# Patient Record
Sex: Female | Born: 1945 | Race: White | Hispanic: No | Marital: Married | Smoking: Never smoker
Health system: Southern US, Community
[De-identification: ages and names within clinical notes are randomized; demographics above are authoritative.]

## PROBLEM LIST (undated history)

## (undated) DIAGNOSIS — M1990 Unspecified osteoarthritis, unspecified site: Secondary | ICD-10-CM

## (undated) DIAGNOSIS — I499 Cardiac arrhythmia, unspecified: Secondary | ICD-10-CM

## (undated) DIAGNOSIS — M8580 Other specified disorders of bone density and structure, unspecified site: Secondary | ICD-10-CM

## (undated) DIAGNOSIS — I471 Supraventricular tachycardia, unspecified: Secondary | ICD-10-CM

## (undated) DIAGNOSIS — E756 Lipid storage disorder, unspecified: Secondary | ICD-10-CM

## (undated) DIAGNOSIS — I4892 Unspecified atrial flutter: Secondary | ICD-10-CM

## (undated) DIAGNOSIS — R0989 Other specified symptoms and signs involving the circulatory and respiratory systems: Secondary | ICD-10-CM

## (undated) DIAGNOSIS — R739 Hyperglycemia, unspecified: Secondary | ICD-10-CM

## (undated) DIAGNOSIS — N6019 Diffuse cystic mastopathy of unspecified breast: Secondary | ICD-10-CM

## (undated) DIAGNOSIS — I351 Nonrheumatic aortic (valve) insufficiency: Secondary | ICD-10-CM

## (undated) DIAGNOSIS — K2100 Gastro-esophageal reflux disease with esophagitis, without bleeding: Secondary | ICD-10-CM

## (undated) DIAGNOSIS — I10 Essential (primary) hypertension: Secondary | ICD-10-CM

## (undated) DIAGNOSIS — I4891 Unspecified atrial fibrillation: Secondary | ICD-10-CM

## (undated) DIAGNOSIS — G4730 Sleep apnea, unspecified: Secondary | ICD-10-CM

## (undated) DIAGNOSIS — E669 Obesity, unspecified: Secondary | ICD-10-CM

## (undated) DIAGNOSIS — H4010X0 Unspecified open-angle glaucoma, stage unspecified: Secondary | ICD-10-CM

## (undated) DIAGNOSIS — E785 Hyperlipidemia, unspecified: Secondary | ICD-10-CM

## (undated) DIAGNOSIS — K5790 Diverticulosis of intestine, part unspecified, without perforation or abscess without bleeding: Secondary | ICD-10-CM

## (undated) DIAGNOSIS — D369 Benign neoplasm, unspecified site: Secondary | ICD-10-CM

## (undated) HISTORY — DX: Hyperglycemia, unspecified: R73.9

## (undated) HISTORY — DX: Supraventricular tachycardia: I47.1

## (undated) HISTORY — DX: Other specified symptoms and signs involving the circulatory and respiratory systems: R09.89

## (undated) HISTORY — DX: Other specified disorders of bone density and structure, unspecified site: M85.80

## (undated) HISTORY — DX: Supraventricular tachycardia, unspecified: I47.10

## (undated) HISTORY — DX: Hyperlipidemia, unspecified: E78.5

## (undated) HISTORY — DX: Diverticulosis of intestine, part unspecified, without perforation or abscess without bleeding: K57.90

## (undated) HISTORY — DX: Unspecified open-angle glaucoma, stage unspecified: H40.10X0

## (undated) HISTORY — DX: Obesity, unspecified: E66.9

## (undated) HISTORY — DX: Lipid storage disorder, unspecified: E75.6

## (undated) HISTORY — DX: Benign neoplasm, unspecified site: D36.9

## (undated) HISTORY — DX: Essential (primary) hypertension: I10

## (undated) HISTORY — DX: Nonrheumatic aortic (valve) insufficiency: I35.1

## (undated) HISTORY — DX: Unspecified atrial flutter: I48.92

## (undated) HISTORY — DX: Gastro-esophageal reflux disease with esophagitis, without bleeding: K21.00

## (undated) HISTORY — DX: Unspecified osteoarthritis, unspecified site: M19.90

## (undated) HISTORY — DX: Sleep apnea, unspecified: G47.30

## (undated) HISTORY — DX: Diffuse cystic mastopathy of unspecified breast: N60.19

---

## 2002-04-21 ENCOUNTER — Other Ambulatory Visit: Admission: RE | Admit: 2002-04-21 | Discharge: 2002-04-21 | Payer: Self-pay | Admitting: Obstetrics & Gynecology

## 2002-07-22 ENCOUNTER — Other Ambulatory Visit: Admission: RE | Admit: 2002-07-22 | Discharge: 2002-07-22 | Payer: Self-pay | Admitting: Obstetrics & Gynecology

## 2003-07-15 ENCOUNTER — Other Ambulatory Visit: Admission: RE | Admit: 2003-07-15 | Discharge: 2003-07-15 | Payer: Self-pay | Admitting: Obstetrics & Gynecology

## 2004-01-06 ENCOUNTER — Other Ambulatory Visit: Admission: RE | Admit: 2004-01-06 | Discharge: 2004-01-06 | Payer: Self-pay | Admitting: *Deleted

## 2004-11-02 ENCOUNTER — Other Ambulatory Visit: Admission: RE | Admit: 2004-11-02 | Discharge: 2004-11-02 | Payer: Self-pay | Admitting: Obstetrics & Gynecology

## 2004-12-17 HISTORY — PX: OTHER SURGICAL HISTORY: SHX169

## 2006-01-02 ENCOUNTER — Other Ambulatory Visit: Admission: RE | Admit: 2006-01-02 | Discharge: 2006-01-02 | Payer: Self-pay | Admitting: Obstetrics & Gynecology

## 2007-12-18 HISTORY — PX: BREAST BIOPSY: SHX20

## 2015-12-18 DIAGNOSIS — R319 Hematuria, unspecified: Secondary | ICD-10-CM

## 2015-12-18 HISTORY — DX: Hematuria, unspecified: R31.9

## 2019-11-20 ENCOUNTER — Encounter (INDEPENDENT_AMBULATORY_CARE_PROVIDER_SITE_OTHER): Payer: Self-pay | Admitting: *Deleted

## 2019-12-08 ENCOUNTER — Other Ambulatory Visit (INDEPENDENT_AMBULATORY_CARE_PROVIDER_SITE_OTHER): Payer: Self-pay | Admitting: *Deleted

## 2019-12-08 ENCOUNTER — Encounter (INDEPENDENT_AMBULATORY_CARE_PROVIDER_SITE_OTHER): Payer: Self-pay | Admitting: *Deleted

## 2019-12-08 DIAGNOSIS — Z860100 Personal history of colon polyps, unspecified: Secondary | ICD-10-CM | POA: Insufficient documentation

## 2019-12-09 ENCOUNTER — Encounter (INDEPENDENT_AMBULATORY_CARE_PROVIDER_SITE_OTHER): Payer: Self-pay

## 2020-01-18 DIAGNOSIS — I639 Cerebral infarction, unspecified: Secondary | ICD-10-CM

## 2020-01-18 HISTORY — DX: Cerebral infarction, unspecified: I63.9

## 2020-01-19 ENCOUNTER — Telehealth (INDEPENDENT_AMBULATORY_CARE_PROVIDER_SITE_OTHER): Payer: Self-pay | Admitting: *Deleted

## 2020-01-19 ENCOUNTER — Encounter (INDEPENDENT_AMBULATORY_CARE_PROVIDER_SITE_OTHER): Payer: Self-pay | Admitting: *Deleted

## 2020-01-19 ENCOUNTER — Other Ambulatory Visit (INDEPENDENT_AMBULATORY_CARE_PROVIDER_SITE_OTHER): Payer: Self-pay | Admitting: *Deleted

## 2020-01-19 MED ORDER — SUPREP BOWEL PREP KIT 17.5-3.13-1.6 GM/177ML PO SOLN: 1.0000 | Freq: Once | ORAL | 0 refills | Status: AC

## 2020-01-19 NOTE — Telephone Encounter (Signed)
Patient needs suprep TCS sch'd 3/10

## 2020-01-25 ENCOUNTER — Telehealth (INDEPENDENT_AMBULATORY_CARE_PROVIDER_SITE_OTHER): Payer: Self-pay | Admitting: *Deleted

## 2020-01-25 NOTE — Telephone Encounter (Signed)
Referring MD/PCP: pomposini   Procedure: tcs  Reason/Indication:  Hx polyps  Has patient had this procedure before?  Yes, 2015  If so, when, by whom and where?    Is there a family history of colon cancer?  no  Who?  What age when diagnosed?    Is patient diabetic?   no      Does patient have prosthetic heart valve or mechanical valve?  no  Do you have a pacemaker/defibrillator?  no  Has patient ever had endocarditis/atrial fibrillation? no  Does patient use oxygen? no  Has patient had joint replacement within last 12 months?  no  Is patient constipated or do they take laxatives? no  Does patient have a history of alcohol/drug use?  no  Is patient on blood thinner such as Coumadin, Plavix and/or Aspirin? yes  Medications: asa 81 mg daily, celebrex 200 mg daily, pravastatin 40 mg daily, toprol 25 mg daily, losartan 100 mg daily, nexium 40 mg daily, niacin 1000 mg daily, latanoprost each eye, folic acid Q000111Q mcg daily, famotidine 20 mg daily, calcium 600 mg daily, vit d, fish oil flax seed oil, vit b, vit d3, claritian  Allergies: simvastatin, loratab  Medication Adjustment per Dr Laural Golden: asa 2 days  Procedure date & time:

## 2020-02-22 ENCOUNTER — Other Ambulatory Visit (HOSPITAL_COMMUNITY): Payer: Medicare Other

## 2020-02-24 ENCOUNTER — Encounter (HOSPITAL_COMMUNITY): Payer: Self-pay

## 2020-02-24 ENCOUNTER — Ambulatory Visit (HOSPITAL_COMMUNITY): Admit: 2020-02-24 | Payer: Medicare Other | Admitting: Internal Medicine

## 2020-02-24 SURGERY — COLONOSCOPY
Anesthesia: Moderate Sedation

## 2020-03-01 ENCOUNTER — Telehealth: Payer: Self-pay | Admitting: Neurology

## 2020-03-01 NOTE — Telephone Encounter (Signed)
I spoke with the pt. She said she had already called the health dept and they rescheduled her vaccine for the 6th. She verbalized appreciation for the call.

## 2020-03-01 NOTE — Telephone Encounter (Signed)
Pt is scheduled for her Covid-19 vaccine the day before her appointment 03-31. Pt would like to know if she should be ok to come in or if she should r/s.  Pt declined to option to just r/s now.  Pt states she will just wait to hear from RN

## 2020-03-15 NOTE — Progress Notes (Addendum)
[facility]    Provider:  Dr Jaynee Eagles Requesting Provider: Pomposini, Cherly Anderson, MD Primary Care Provider:  Clinton Quant, MD  CC:  Stroke  HPI:  Meghan Keller is a 74 y.o. female here as requested by Pomposini, Cherly Anderson, MD for cerebrovascular cerebrovascular accident, she has a past medical history of hyperlipidemia, lipid storage disease, obesity, elevated hemoglobin A1c, hypertension, aortic valve regurgitation, paroxysmal supraventricular tachycardia, cerebrovascular accident, arthritis, sleep apnea, carotid bruit, history of herpes zoster.  I reviewed extensive notes, MRI of the brain report from January 23, 2020, report reads multiple acute infarcts within the right cerebral hemisphere in a watershed distribution, no suspicious areas of enhancement are noted, CT of the head the day prior report states subacute infarct of the right middle cerebral artery distribution, underlying mass with vasogenic edema could also be considered, last when the MRI was suggested which was completed as above.  She has never smoked, hospital course showed she was admitted for acute ischemic cerebrovascular accident after presenting with left-sided weakness, CT head showed probable subacute infarct in the right MCA, carotid Doppler ultrasound was negative for hemodynamically significant stenosis, echocardiogram noted normal left ventricular size and normal systolic function with ejection fraction 60 to 65% mild ventricular dilation, borderline left ventricle wall thickness.  As above, MRI of the brain noted multiple acute infarcts in the right cerebral hemisphere in a watershed distribution.  Patient was treated with aspirin and Crestor.  She was transitioned to Eliquis for anticoagulation for stroke prophylaxis, she was evaluated by physical therapy and Occupational Therapy, neurology recommended 0 monitor for 2 weeks of discharge.  I reviewed labs which showed BUN 16, creatinine 0.52  otherwise unremarkable CBC.  The neurologist was Dr. Rob Hickman.   Thursday morning she got up, ran a lot of errands, she did not feel well that night, she got up the next day but no weakness, she did feel unsteady. She thought she may have covid as she was sneezing. She drove to a clinic and she was tested and it was negative, but she still didn't feel well, her blood pressure was 187/101 and she was tachycardic as well, usually it is around 140 or less and so it was unusual, she had a few SVTs but that day she wasn't paying attention. Her husband took her to the ER, CT and MRI confirmed the stroke. She ws told she ws in atrial fibrillation in the ED, but Dr. Delice Lesch at [facility] in [place] didn't have a record of the afib. Her cholesterol is ldl is < 70, she went to PT and OT and she feels much improved as fr as strength, she may have some sensory changes around the left leg in the mornings but no numbness or tingling, weakness improved, swallowing fine, no falls, using a walking aid and she is still in physical therapy. No vision changes or vision loss. She has mild sleep apnea, I encouraged her to use it.   Reviewed notes, labs and imaging from outside physicians, which showed:  [id number] 5.7  I reviewed MRI of the brain report, mild age-related cerebral atrophy, patchy areas of subcortical edema throughout the right frontal lobe insula and right parietal lobe, corresponding with areas of increased DWI low ADC signal, multiple acute infarcts within the right cerebral hemisphere in a watershed distribution, no suspicious areas of enhancement are noted.  I also reviewed report from the duplex carotid arteries, small amount calcified atherosclerotic plaque within the left carotid bulb, both vertebral arteries  are patent with antegrade flow, the estimate of stenosis include in this report: No significant carotid stenosis.  I reviewed echocardiogram which did not show PFO or thrombus.  Review of Systems: Patient  complains of symptoms per HPI as well as the following symptoms:weakness,fatigue. Pertinent negatives and positives per HPI. All others negative.   Social History   Socioeconomic History  . Marital status: Married    Spouse name: Not on file  . Number of children: 2  . Years of education: 12+  . Highest education level: Not on file  Occupational History  . Not on file  Tobacco Use  . Smoking status: Never Smoker  . Smokeless tobacco: Never Used  Substance and Sexual Activity  . Alcohol use: Never  . Drug use: Never  . Sexual activity: Not on file  Other Topics Concern  . Not on file  Social History Narrative   Lives at home with husband   Right handed   Caffeine: 2 cups/day    Social Determinants of Health   Financial Resource Strain:   . Difficulty of Paying Living Expenses:   Food Insecurity:   . Worried About Charity fundraiser in the Last Year:   . Arboriculturist in the Last Year:   Transportation Needs:   . Film/video editor (Medical):   Marland Kitchen Lack of Transportation (Non-Medical):   Physical Activity:   . Days of Exercise per Week:   . Minutes of Exercise per Session:   Stress:   . Feeling of Stress :   Social Connections:   . Frequency of Communication with Friends and Family:   . Frequency of Social Gatherings with Friends and Family:   . Attends Religious Services:   . Active Member of Clubs or Organizations:   . Attends Archivist Meetings:   Marland Kitchen Marital Status:   Intimate Partner Violence:   . Fear of Current or Ex-Partner:   . Emotionally Abused:   Marland Kitchen Physically Abused:   . Sexually Abused:     Family History  Problem Relation Age of Onset  . Bladder Cancer Mother   . Leukemia Mother   . Hypertension Mother   . Other Mother        smoked  . Esophageal cancer Father   . Hypertension Father   . Atrial fibrillation Father   . Other Father        smoked  . High Cholesterol Father   . Glaucoma Father   . Hypertension Brother   .  Heart attack Brother   . Hypertension Brother   . Atrial fibrillation Brother        heart ablation  . High Cholesterol Brother   . Stroke Paternal Grandfather        pt believes he had one in his 51s     Past Medical History:  Diagnosis Date  . Adenomatous polyp    ascending colon  . Aortic valve regurgitation    "mild"  . Arthritis   . Benign essential hypertension   . Blood in urine 2017  . Carotid bruit   . CVA (cerebral vascular accident) ([place]) 01/2020  . Diverticular disease   . Fibrocystic disease of breast   . GERD with esophagitis   . Hyperglycemia   . Hyperlipidemia   . Lipid storage disease   . Obesity   . Osteopenia   . PSVT (paroxysmal supraventricular tachycardia) ([place])   . Sleep apnea    "mild", was on CPAP before  stroke, pt states she needs to get back on it   . Wide-angle glaucoma     Patient Active Problem List   Diagnosis Date Noted  . Acute embolic stroke ([place]) [id number]  . Hx of colonic polyps 12/08/2019    Past Surgical History:  Procedure Laterality Date  . BREAST BIOPSY Left 12/2007   Dr Audrie Lia- fibrocystic Fibroadenoma. pt reports this was benign both times  . rotator cuff surgery Right 2006    Current Outpatient Medications  Medication Sig Dispense Refill  . Calcium Carb-Cholecalciferol (CALCIUM 600+D) 600-800 MG-UNIT TABS Take by mouth.    . Cyanocobalamin (VITAMIN B12) 500 MCG TABS Take by mouth daily.    Marland Kitchen ELIQUIS 5 MG TABS tablet Take 5 mg by mouth 2 (two) times daily.    Marland Kitchen esomeprazole (NEXIUM) 40 MG capsule Take 40 mg by mouth daily.    . famotidine (PEPCID) 20 MG tablet Take 20 mg by mouth at bedtime.    . folic acid (FOLVITE) Q000111Q MCG tablet Take 800 mcg by mouth daily.     Marland Kitchen loratadine (CLARITIN) 10 MG tablet Take 10 mg by mouth daily.    Marland Kitchen losartan (COZAAR) 100 MG tablet Take 100 mg by mouth daily.    . metoprolol succinate (TOPROL-XL) 25 MG 24 hr tablet Take 25 mg by mouth daily.    . niacin (NIASPAN) 1000 MG CR tablet Take  1,000 mg by mouth in the morning and at bedtime.    . rosuvastatin (CRESTOR) 10 MG tablet Take 10 mg by mouth daily.    . Flaxseed, Linseed, (FLAXSEED OIL) 1000 MG CAPS Take by mouth daily.    Marland Kitchen OVER THE COUNTER MEDICATION Fish oil 1200 mg + D 2000 IU     No current facility-administered medications for this visit.    Allergies as of 03/16/2020 - Review Complete 03/16/2020  Allergen Reaction Noted  . Anaprox [naproxen]  03/16/2020  . Ansaid [flurbiprofen]  03/16/2020  . Aspirin  03/16/2020  . Lortab [hydrocodone-acetaminophen]  03/16/2020    Vitals: BP 134/68 (BP Location: Right Arm, Patient Position: Sitting)   Pulse 69   Temp (!) 97 F (36.1 C)   Ht 5\' 3"  (1.6 m)   Wt 177 lb (80.3 kg)   BMI 31.35 kg/m  Last Weight:  Wt Readings from Last 1 Encounters:  03/16/20 177 lb (80.3 kg)   Last Height:   Ht Readings from Last 1 Encounters:  03/16/20 5\' 3"  (1.6 m)     Physical exam: Exam: Gen: NAD, conversant, well nourised, obese, well groomed                     CV: RRR, no MRG. No Carotid Bruits. No peripheral edema, warm, nontender Eyes: Conjunctivae clear without exudates or hemorrhage  Neuro: Detailed Neurologic Exam  Speech:    Speech is normal; fluent and spontaneous with normal comprehension.  Cognition:    The patient is oriented to person, place, and time;     recent and remote memory intact;     language fluent;     normal attention, concentration,     fund of knowledge Cranial Nerves:    The pupils are equal, round, and reactive to light. The fundi are flat. Visual fields are full to finger confrontation. Extraocular movements are intact. Trigeminal sensation is intact and the muscles of mastication are normal. The face is symmetric. The palate elevates in the midline. Hearing intact. Voice is normal. Shoulder shrug is normal. The  tongue has normal motion without fasciculations.   Coordination:    Normal finger to nose   Gait:    Slightly antalgic using  a cane  Motor Observation:    No asymmetry, no atrophy, and no involuntary movements noted. Tone:    Normal muscle tone.    Posture:    Posture is normal. normal erect    Strength:Left arm proximal weakness otherwise strength is V/V in the upper and lower limbs.      Sensation: intact to LT     Reflex Exam:  DTR's: left-sided hyperreflexia   .   Toes:    Left toe upgoing  Clonus:    Clonus is absent.    Assessment/Plan:  74 y.o. female here as requested by Pomposini, Cherly Anderson, MD for cerebrovascular cerebrovascular accident, she has a past medical history of hyperlipidemia, lipid storage disease, obesity, elevated hemoglobin A1c, hypertension, aortic valve regurgitation, paroxysmal supraventricular tachycardia, cerebrovascular accident, arthritis, sleep apnea, carotid bruit, history of herpes zoster.   I personally reviewed the images, multiple strokes in the left MCA as well as ACA distributions, I am not sure why patient is on Eliquis and we need to make sure there is there is no atrial fibrillation or any indication to place her on Eliquis.  I did discuss this with patient at length.We will see what cardiology says about the eliquis. If there is a reason to keep on Eliquis then stay the course. But if no reason for Eliquis will likely need to consider (Transesphageal echo) and loop recorder. I will order CTA of the head and neck to complete the stroke workup. And I will tough base with cardiology and neurology to see why the Eliquis was started.  I had a long d/w patient about her recent stroke, risk for recurrent stroke/TIAs, personally independently reviewed imaging studies and stroke evaluation results and answered questions.Continue eliquis for secondary stroke prevention and maintain strict control of hypertension with blood pressure goal below 130/90, diabetes with hemoglobin A1c goal below 6.5% and lipids with LDL cholesterol goal below 70 mg/dL I also advised the patient to eat  a healthy diet with plenty of whole grains, cereals, fruits and vegetables, exercise regularly and maintain ideal body weight .Followup in the future with me in 2 months or call earlier if necessary.  I tried calling Dr. Kotlaba(cardiologist), left a message. Called Dr. Delia Chimes office and patient mistaken, she saw Dr. Sammie Bench. Left a message for Dr. Rob Hickman as well(neurology)   Orders Placed This Encounter  Procedures  . CT ANGIO HEAD W OR WO CONTRAST  . CT ANGIO NECK W OR WO CONTRAST     Cc: Pomposini, Cherly Anderson, MD,  Raechel Chute MD  I spent 90  minutes of face-to-face and non-face-to-face time with patient on the  1. Acute embolic stroke (HCC)    diagnosis.  This included previsit chart review, lab review, study review, order entry, electronic health record documentation, patient education on the different diagnostic and therapeutic options, counseling and coordination of care, risks and benefits of management, compliance, or risk factor reduction   Sarina Ill, MD  [facility] [address] [place], [place] [address]  Phone [phone] Fax [phone]

## 2020-03-16 ENCOUNTER — Ambulatory Visit: Payer: Medicare Other | Admitting: Neurology

## 2020-03-16 ENCOUNTER — Encounter: Payer: Self-pay | Admitting: Neurology

## 2020-03-16 ENCOUNTER — Other Ambulatory Visit: Payer: Self-pay

## 2020-03-16 ENCOUNTER — Telehealth: Payer: Self-pay | Admitting: Neurology

## 2020-03-16 DIAGNOSIS — I639 Cerebral infarction, unspecified: Secondary | ICD-10-CM | POA: Insufficient documentation

## 2020-03-16 NOTE — Telephone Encounter (Signed)
[employer] medicare order sent to GI. No auth via [employer] they will reach out to the patient to schedule.

## 2020-03-16 NOTE — Patient Instructions (Addendum)
Meghan Chute MD Cardiologist in [place], [place] Address: [address] [place] [address] Phone: [phone]  Dr. Casandra Doffing - neurologist, Dr. Rob Hickman  We will see what cardiology says about the eliquis. If there is a reason to keep on Eliquis then stay the course. But if no reason for Eliquis will likely need to consider (Transesphageal echo) and loop recorder. I will order CTA of the head and neck to complete the stroke workup. And I will tough base with cardiology and neurology to see why the Eliquis was started.    Ischemic Stroke  An ischemic stroke (cerebrovascular accident, or CVA) is the sudden death of brain tissue that occurs when an area of the brain does not get enough oxygen. It is a medical emergency that must be treated right away. An ischemic stroke can cause permanent loss of brain function. This can cause problems with how different parts of your body function. What are the causes? This condition is caused by a decrease of oxygen supply to an area of the brain, which may be the result of:  A small blood clot (embolus) or a buildup of plaque in the blood vessels (atherosclerosis) that blocks blood flow in the brain.  An abnormal heart rhythm (atrial fibrillation).  A blocked or damaged artery in the head or neck. Sometimes the cause of stroke is not known (cryptogenic). What increases the risk? Certain factors may make you more likely to develop this condition. Some of these factors are things that you can change, such as:  Obesity.  Smoking cigarettes.  Taking oral birth control, especially if you also use tobacco.  Physical inactivity.  Excessive alcohol use.  Use of illegal drugs, especially cocaine and methamphetamine. Other risk factors include:  High blood pressure (hypertension).  High cholesterol.  Diabetes mellitus.  Heart disease.  Being [place] American, Native American, Hispanic, or [place] Native.  Being over age 65.  Family  history of stroke.  Previous history of blood clots, stroke, or transient ischemic attack (TIA).  Sickle cell disease.  Being a woman with a history of preeclampsia.  Migraine headache.  Sleep apnea.  Irregular heartbeats, such as atrial fibrillation.  Chronic inflammatory diseases, such as rheumatoid arthritis or lupus.  Blood clotting disorders (hypercoagulable state). What are the signs or symptoms? Symptoms of this condition usually develop suddenly, or you may notice them after waking up from sleep. Symptoms may include sudden:  Weakness or numbness in your face, arm, or leg, especially on one side of your body.  Trouble walking or difficulty moving your arms or legs.  Loss of balance or coordination.  Confusion.  Slurred speech (dysarthria).  Trouble speaking, understanding speech, or both (aphasia).  Vision changes--such as double vision, blurred vision, or loss of vision--in one or both eyes.  Dizziness.  Nausea and vomiting.  Severe headache with no known cause. The headache is often described as the worst headache ever experienced. If possible, make note of the exact time that you last felt like your normal self and what time your symptoms started. Tell your health care provider. If symptoms come and go, this could be a sign of a warning stroke, or TIA. Get help right away, even if you feel better. How is this diagnosed? This condition may be diagnosed based on:  Your symptoms, your medical history, and a physical exam.  CT scan of the brain.  MRI.  CT angiogram. This test uses a computer to take X-rays of your arteries. A dye may be  injected into your blood to show the inside of your blood vessels more clearly.  MRI angiogram. This is a type of MRI that is used to evaluate the blood vessels.  Cerebral angiogram. This test uses X-rays and a dye to show the blood vessels in the brain and neck. You may need to see a health care provider who specializes  in stroke care. A stroke specialist can be seen in person or through communication using telephone or television technology (telemedicine). Other tests may also be done to find the cause of the stroke, such as:  Electrocardiogram (ECG).  Continuous heart monitoring.  Echocardiogram.  Transesophageal echocardiogram (TEE).  Carotid ultrasound.  A scan of the brain circulation.  Blood tests.  Sleep study to check for sleep apnea. How is this treated? Treatment for this condition will depend on the duration, severity, and cause of your symptoms and on the area of the brain affected. It is very important to get treatment at the first sign of stroke symptoms. Some treatments work better if they are done within 3-6 hours of the onset of stroke symptoms. These initial treatments may include:  Aspirin.  Medicines to control blood pressure.  Medicine given by injection to dissolve the blood clot (thrombolytic).  Treatments given directly to the affected artery to remove or dissolve the blood clot. Other treatment options may include:  Oxygen.  IV fluids.  Medicines to thin the blood (anticoagulants or antiplatelets).  Procedures to increase blood flow. Medicines and changes to your diet may be used to help treat and manage risk factors for stroke, such as diabetes, high cholesterol, and high blood pressure. After a stroke, you may work with physical, speech, mental health, or occupational therapists to help you recover. Follow these instructions at home: Medicines  Take over-the-counter and prescription medicines only as told by your health care provider.  If you were told to take a medicine to thin your blood, such as aspirin or an anticoagulant, take it exactly as told by your health care provider. ? Taking too much blood-thinning medicine can cause bleeding. ? If you do not take enough blood-thinning medicine, you will not have the protection that you need against another  stroke and other problems.  Understand the side effects of taking anticoagulant medicine. When taking this type of medicine, make sure you: ? Hold pressure over any cuts for longer than usual. ? Tell your dentist and other health care providers that you are taking anticoagulants before you have any procedures that may cause bleeding. ? Avoid activities that may cause trauma or injury. Eating and drinking  Follow instructions from your health care provider about diet.  Eat healthy foods.  If your ability to swallow was affected by the stroke, you may need to take steps to avoid choking, such as: ? Taking small bites when eating. ? Eating foods that are soft or pureed. Safety  Follow instructions from your health care team about physical activity.  Use a walker or cane as told by your health care provider.  Take steps to create a safe home environment in order to reduce the risk of falls. This may include: ? Having your home looked at by specialists. ? Installing grab bars in the bedroom and bathroom. ? Using safety equipment, such as raised toilets and a seat in the shower. General instructions  Do not use any tobacco products, such as cigarettes, chewing tobacco, and e-cigarettes. If you need help quitting, ask your health care provider.  Limit alcohol  intake to no more than 1 drink a day for nonpregnant women and 2 drinks a day for men. One drink equals 12 oz of beer, 5 oz of wine, or 1 oz of hard liquor.  If you need help to stop using drugs or alcohol, ask your health care provider about a referral to a program or specialist.  Maintain an active and healthy lifestyle. Get regular exercise as told by your health care provider.  Keep all follow-up visits as told by your health care provider, including visits with all specialists on your health care team. This is important. How is this prevented? Your risk of another stroke can be decreased by managing high blood pressure, high  cholesterol, diabetes, heart disease, sleep apnea, and obesity. It can also be decreased by quitting smoking, limiting alcohol, and staying physically active. Your health care provider will continue to work with you on measures to prevent short-term and long-term complications of stroke. Get help right away if:   You have any symptoms of a stroke. "BE FAST" is an easy way to remember the main warning signs of a stroke: ? B - Balance. Signs are dizziness, sudden trouble walking, or loss of balance. ? E - Eyes. Signs are trouble seeing or a sudden change in vision. ? F - Face. Signs are sudden weakness or numbness of the face, or the face or eyelid drooping on one side. ? A - Arms. Signs are weakness or numbness in an arm. This happens suddenly and usually on one side of the body. ? S - Speech. Signs are sudden trouble speaking, slurred speech, or trouble understanding what people say. ? T - Time. Time to call emergency services. Write down what time symptoms started.  You have other signs of a stroke, such as: ? A sudden, severe headache with no known cause. ? Nausea or vomiting. ? Seizure.  These symptoms may represent a serious problem that is an emergency. Do not wait to see if the symptoms will go away. Get medical help right away. Call your local emergency services (911 in the [place] Do not drive yourself to the hospital. Summary  An ischemic stroke (cerebrovascular accident, or CVA) is the sudden death of brain tissue that occurs when an area of the brain does not get enough oxygen.  Symptoms of this condition usually develop suddenly, or you may notice them after waking up from sleep.  It is very important to get treatment at the first sign of stroke symptoms. Stroke is a medical emergency that must be treated right away. This information is not intended to replace advice given to you by your health care provider. Make sure you discuss any questions you have with your health care  provider. Document Revised: 08/22/2018 Document Reviewed: 02/29/2016 Elsevier Patient Education  [employer].

## 2020-03-21 ENCOUNTER — Telehealth: Payer: Self-pay | Admitting: *Deleted

## 2020-03-21 NOTE — Telephone Encounter (Signed)
R/c note from [facility]. Notes on Meghan Keller.

## 2020-04-04 ENCOUNTER — Ambulatory Visit
Admission: RE | Admit: 2020-04-04 | Discharge: 2020-04-04 | Disposition: A | Discharge status: Home / Self Care | Payer: Medicare Other | Source: Ambulatory Visit | Attending: Neurology | Admitting: Neurology

## 2020-04-04 ENCOUNTER — Other Ambulatory Visit: Payer: Self-pay

## 2020-04-04 DIAGNOSIS — I639 Cerebral infarction, unspecified: Secondary | ICD-10-CM

## 2020-04-04 IMAGING — CT CT ANGIO HEAD
1 of 4 series · 6 of 30 positions shown · IV contrast (APPLIED)
Comparison: None.

CLINICAL DATA: 73-year-old female status post embolic stroke in
[REDACTED] evaluated in [HOSPITAL] [facility].

Creatinine was obtained on site at [HOSPITAL] at [HOSPITAL].
Results: Creatinine 0.5 mg/dL.
EXAM:
CT ANGIOGRAPHY HEAD AND NECK
TECHNIQUE: Multidetector CT imaging of the head and neck was performed using
the standard protocol during bolus administration of intravenous
contrast. Multiplanar CT image reconstructions and MIPs were
obtained to evaluate the vascular anatomy. Carotid stenosis
measurements (when applicable) are obtained utilizing NASCET
criteria, using the distal internal carotid diameter as the
denominator.
CONTRAST:  75mL [ID] IOPAMIDOL ([ID]) INJECTION 76%

[Series 9: head/neck angio · axial · 0.46mm/px · z∈[-342,-88]mm · 6 of 179 slices shown]
[im 26/179  brain]
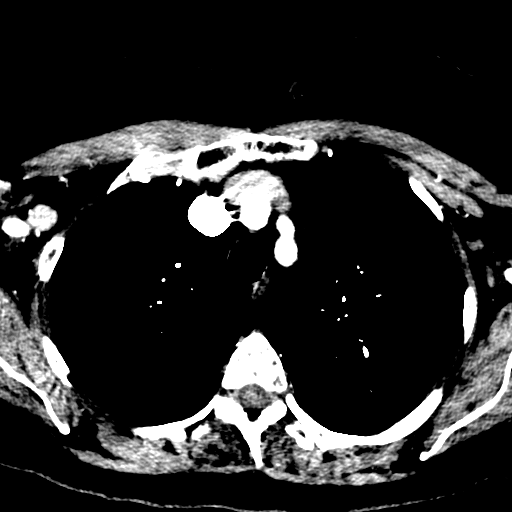
[im 51/179  bone]
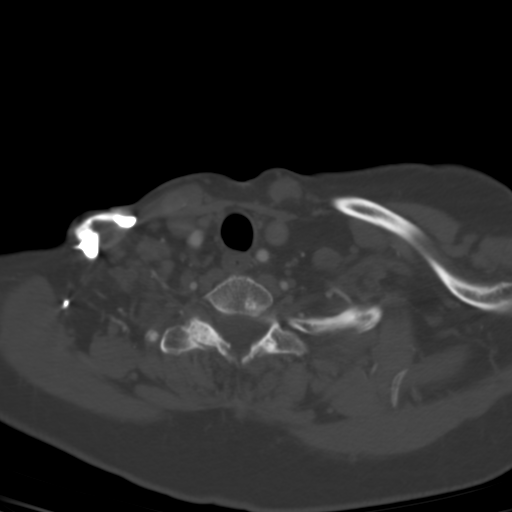
[im 77/179  brain]
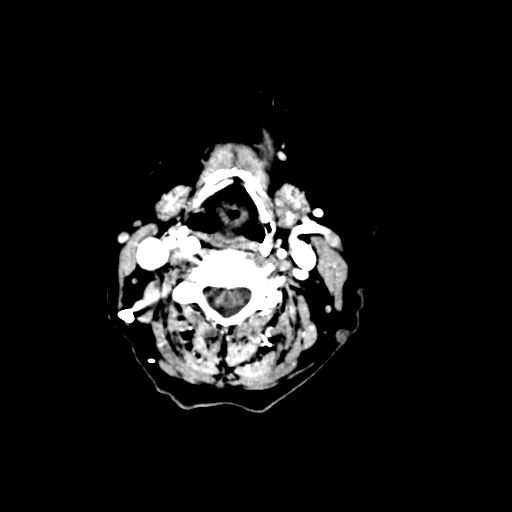
[im 102/179  bone]
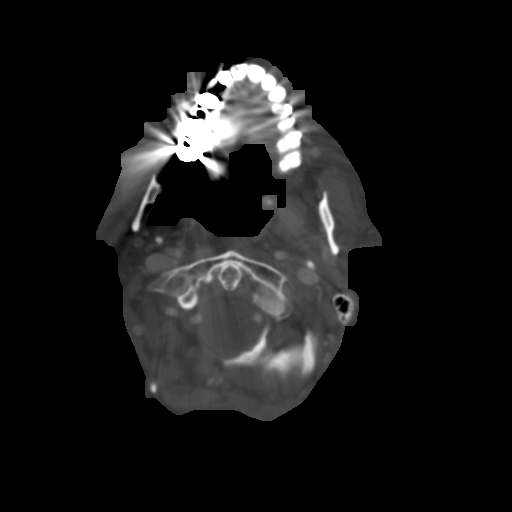
[im 128/179  brain]
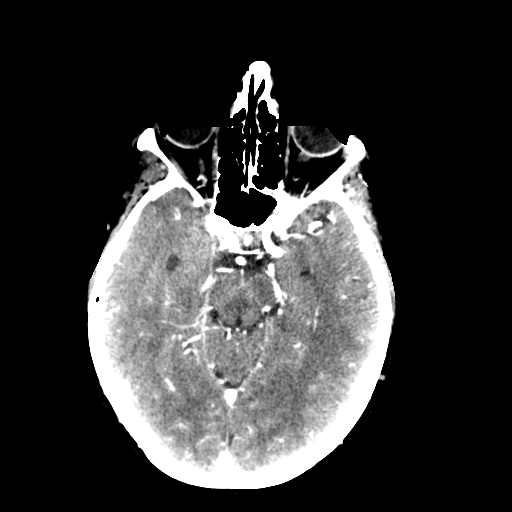
[im 153/179  bone]
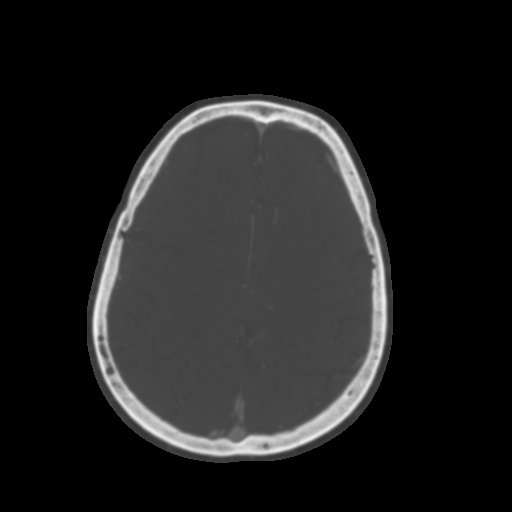

[6 of 30 positions shown; findings below may reference images not displayed]

FINDINGS: CT HEAD

Brain: Patchy encephalomalacia at the right insula and operculum.

Elsewhere largely normal gray-white matter differentiation
throughout the brain. Probable sulcus rather than a small area of
encephalomalacia at the left insula or external capsule on series 3,
image 19. Underlying cerebral volume is normal for age. No midline
shift, ventriculomegaly, mass effect, evidence of mass lesion,
intracranial hemorrhage or evidence of cortically based acute
infarction.

Calvarium and skull base: Negative.

Paranasal sinuses: Paranasal sinuses, tympanic cavities and mastoids
are clear.

Orbits: Visualized orbits and scalp soft tissues are within normal
limits.

CTA NECK

Skeleton: Mild for age spine degeneration. No acute osseous
abnormality identified.

Upper chest: Negative.

Other neck: Negative.

Aortic arch: 3 vessel arch configuration. Mild arch atherosclerosis.

Right carotid system: Negative right CCA. Partially retropharyngeal
course of the right carotid bifurcation, and retropharyngeal right
ICA. Tortuosity, but no plaque or stenosis.

Left carotid system: Negative left CCA. Soft and calcified plaque at
the left carotid bifurcation primarily affects the ECA origin.
Tortuous left ICA distal to the bulb with a mildly beaded appearance
(series 14, image 26), but no stenosis to the skull base.

Vertebral arteries:
Normal proximal subclavian arteries and vertebral artery origins.

Tortuous right vertebral artery, patent to the skull base without
stenosis.

Tortuous left vertebral artery is fairly codominant and patent to
the skull base without stenosis.

CTA HEAD

Posterior circulation: Codominant distal vertebral arteries are
patent without plaque or stenosis. Normal PICA origins. Tortuous but
otherwise normal vertebrobasilar junction. Mildly tortuous basilar
artery without plaque or stenosis. Ectatic basilar tip without
discrete aneurysm (series 14, image 27). Otherwise normal SCA and
PCA origins. Posterior communicating arteries are diminutive or
absent. Bilateral PCA branches are within normal limits.

Anterior circulation: Tortuous left ICA siphon, especially in the
cavernous segment, but no plaque or stenosis. A small infundibulum
is suspected on series 12, image 129.

Similar tortuosity of the right ICA siphon, cavernous segment
without plaque or stenosis.

Patent carotid termini. Normal MCA and ACA origins. The right A1 is
dominant. The anterior communicating artery may be fenestrated,
normal variant. Otherwise normal anterior communicating and
bilateral ACA branches. Left MCA M1 segment and bifurcation are
patent without stenosis. Left MCA branches are within normal limits.
Right MCA M1 segment and bifurcation are patent without stenosis.
Right MCA branches also appear within normal limits. No branch
occlusion can be identified, although there is a mild paucity of
middle division branches compared to the left side.

Venous sinuses: Patent.

Anatomic variants: Mildly dominant right ACA A1.

Review of the MIP images confirms the above findings
IMPRESSION: 1. No large vessel occlusion. Generalized arterial tortuosity.
Minimal atherosclerosis in the head and neck with no arterial
stenosis.
2. A beaded appearance of the cervical left ICA is suggestive of
Fibromuscular Dysplasia (FMD).
3. Ectatic basilar artery tip without discrete aneurysm. A small
infundibulum of the distal left ICA is suspected (normal variant).
4. Patchy chronic infarct of the right MCA middle division.
Otherwise normal for age non contrast CT appearance of the brain.

## 2020-04-04 MED ORDER — IOPAMIDOL (ISOVUE-370) INJECTION 76%
75.0000 mL | Freq: Once | INTRAVENOUS | Status: AC | PRN
  Administered 2020-04-04: 75 mL via INTRAVENOUS

## 2020-04-06 ENCOUNTER — Telehealth: Payer: Self-pay | Admitting: Neurology

## 2020-04-06 ENCOUNTER — Telehealth (HOSPITAL_COMMUNITY): Payer: Self-pay

## 2020-04-06 NOTE — Telephone Encounter (Signed)
Please remind me in the morning thank you

## 2020-04-06 NOTE — Telephone Encounter (Signed)
Called to schedule consult, no answer, no vm. AW  

## 2020-04-06 NOTE — Telephone Encounter (Signed)
Pt is calling wanting to know if her CT results have come in yet. Please advise.

## 2020-04-07 NOTE — Telephone Encounter (Signed)
Nothing significant found on CTA, no significant stenosis or blockage. thanks

## 2020-04-11 NOTE — Telephone Encounter (Signed)
See mychart.  

## 2020-08-03 ENCOUNTER — Other Ambulatory Visit (INDEPENDENT_AMBULATORY_CARE_PROVIDER_SITE_OTHER): Payer: Self-pay | Admitting: *Deleted

## 2020-08-03 DIAGNOSIS — Z86010 Personal history of colonic polyps: Secondary | ICD-10-CM

## 2020-09-20 NOTE — Progress Notes (Signed)
[facility]    Provider:  Dr Jaynee Eagles Requesting Provider: Pomposini, Cherly Anderson, MD Primary Care Provider:  Clinton Quant, MD  CC:  Stroke  Interval history 09/21/2020:  She had a cardio device and they found aflutter, She strained her ankle. She has some residual numbness in the left arm and left leg. But she is doing very well. She will continue Eliquis. Sometimes she has hypersensitivity in the left arm and in the fingers. Doesn't wake her up at night, not positional. She is on Eliquis for life. She may have to go off of it, I explained that while off of the eliquis she is at increased risk of stroke. But she has to have a colonoscopy. She recognizes when she has the episodes and she has awakened. She is using cpap, explained this will help prevent strokes as well. Taking medications, no side effects.   HPI:  Meghan Keller is a 74 y.o. female here as requested by Pomposini, Cherly Anderson, MD for cerebrovascular cerebrovascular accident, she has a past medical history of hyperlipidemia, lipid storage disease, obesity, elevated hemoglobin A1c, hypertension, aortic valve regurgitation, paroxysmal supraventricular tachycardia, cerebrovascular accident, arthritis, sleep apnea, carotid bruit, history of herpes zoster.  I reviewed extensive notes, MRI of the brain report from January 23, 2020, report reads multiple acute infarcts within the right cerebral hemisphere in a watershed distribution, no suspicious areas of enhancement are noted, CT of the head the day prior report states subacute infarct of the right middle cerebral artery distribution, underlying mass with vasogenic edema could also be considered, last when the MRI was suggested which was completed as above.  She has never smoked, hospital course showed she was admitted for acute ischemic cerebrovascular accident after presenting with left-sided weakness, CT head showed probable subacute infarct in the right MCA, carotid  Doppler ultrasound was negative for hemodynamically significant stenosis, echocardiogram noted normal left ventricular size and normal systolic function with ejection fraction 60 to 65% mild ventricular dilation, borderline left ventricle wall thickness.  As above, MRI of the brain noted multiple acute infarcts in the right cerebral hemisphere in a watershed distribution.  Patient was treated with aspirin and Crestor.  She was transitioned to Eliquis for anticoagulation for stroke prophylaxis, she was evaluated by physical therapy and Occupational Therapy, neurology recommended 0 monitor for 2 weeks of discharge.  I reviewed labs which showed BUN 16, creatinine 0.52 otherwise unremarkable CBC.  The neurologist was Dr. Rob Hickman.   Thursday morning she got up, ran a lot of errands, she did not feel well that night, she got up the next day but no weakness, she did feel unsteady. She thought she may have covid as she was sneezing. She drove to a clinic and she was tested and it was negative, but she still didn't feel well, her blood pressure was 187/101 and she was tachycardic as well, usually it is around 140 or less and so it was unusual, she had a few SVTs but that day she wasn't paying attention. Her husband took her to the ER, CT and MRI confirmed the stroke. She ws told she ws in atrial fibrillation in the ED, but Dr. Delice Lesch at [facility] in [place] didn't have a record of the afib. Her cholesterol is ldl is < 70, she went to PT and OT and she feels much improved as fr as strength, she may have some sensory changes around the left leg in the mornings but no numbness or tingling, weakness improved,  swallowing fine, no falls, using a walking aid and she is still in physical therapy. No vision changes or vision loss. She has mild sleep apnea, I encouraged her to use it.   Reviewed notes, labs and imaging from outside physicians, which showed:  hbba1c 5.7  I reviewed MRI of the brain report, mild age-related  cerebral atrophy, patchy areas of subcortical edema throughout the right frontal lobe insula and right parietal lobe, corresponding with areas of increased DWI low ADC signal, multiple acute infarcts within the right cerebral hemisphere in a watershed distribution, no suspicious areas of enhancement are noted.  I also reviewed report from the duplex carotid arteries, small amount calcified atherosclerotic plaque within the left carotid bulb, both vertebral arteries are patent with antegrade flow, the estimate of stenosis include in this report: No significant carotid stenosis.  I reviewed echocardiogram which did not show PFO or thrombus.  Review of Systems: Patient complains of symptoms per HPI as well as the following symptoms: numbness, sprained ankle . Pertinent negatives and positives per HPI. All others negative    Social History   Socioeconomic History   Marital status: Married    Spouse name: Not on file   Number of children: 2   Years of education: 12+   Highest education level: Not on file  Occupational History   Not on file  Tobacco Use   Smoking status: Never Smoker   Smokeless tobacco: Never Used  Vaping Use   Vaping Use: Never used  Substance and Sexual Activity   Alcohol use: Never   Drug use: Never   Sexual activity: Not on file  Other Topics Concern   Not on file  Social History Narrative   Lives at home with husband   Right handed   Caffeine: 2 cups/day    Social Determinants of Health   Financial Resource Strain:    Difficulty of Paying Living Expenses: Not on file  Food Insecurity:    Worried About [place] in the Last Year: Not on file   [employer] of Food in the Last Year: Not on file  Transportation Needs:    Lack of Transportation (Medical): Not on file   Lack of Transportation (Non-Medical): Not on file  Physical Activity:    Days of Exercise per Week: Not on file   Minutes of Exercise per Session: Not on file  Stress:     Feeling of Stress : Not on file  Social Connections:    Frequency of Communication with Friends and Family: Not on file   Frequency of Social Gatherings with Friends and Family: Not on file   Attends Religious Services: Not on file   Active Member of Clubs or Organizations: Not on file   Attends Archivist Meetings: Not on file   Marital Status: Not on file  Intimate Partner Violence:    Fear of Current or Ex-Partner: Not on file   Emotionally Abused: Not on file   Physically Abused: Not on file   Sexually Abused: Not on file    Family History  Problem Relation Age of Onset   Bladder Cancer Mother    Leukemia Mother    Hypertension Mother    Other Mother        smoked   Esophageal cancer Father    Hypertension Father    Atrial fibrillation Father    Other Father        smoked   High Cholesterol Father    Glaucoma  Father    Hypertension Brother    Heart attack Brother    Hypertension Brother    Atrial fibrillation Brother        heart ablation   High Cholesterol Brother    Stroke Paternal Grandfather        pt believes he had one in his 41s     Past Medical History:  Diagnosis Date   Adenomatous polyp    ascending colon   Aortic valve regurgitation    "mild"   Arthritis    Atrial flutter (HCC)    Benign essential hypertension    Blood in urine 2017   Carotid bruit    CVA (cerebral vascular accident) ([place]) 01/2020   Diverticular disease    Fibrocystic disease of breast    GERD with esophagitis    Hyperglycemia    Hyperlipidemia    Lipid storage disease    Obesity    Osteopenia    PSVT (paroxysmal supraventricular tachycardia) (HCC)    Sleep apnea    "mild", was on CPAP before stroke, pt states she needs to get back on it    Wide-angle glaucoma     Patient Active Problem List   Diagnosis Date Noted   Cardioembolic stroke ([place]) 41/28/7867   Hx of colonic polyps 12/08/2019    Past Surgical  History:  Procedure Laterality Date   BREAST BIOPSY Left 12/2007   Dr Audrie Lia- fibrocystic Fibroadenoma. pt reports this was benign both times   rotator cuff surgery Right 2006    Current Outpatient Medications  Medication Sig Dispense Refill   Calcium Carb-Cholecalciferol (CALCIUM 600+D) 600-800 MG-UNIT TABS Take by mouth.     Cyanocobalamin (VITAMIN B12) 500 MCG TABS Take by mouth daily.     ELIQUIS 5 MG TABS tablet Take 5 mg by mouth 2 (two) times daily.     esomeprazole (NEXIUM) 40 MG capsule Take 40 mg by mouth daily.     famotidine (PEPCID) 20 MG tablet Take 20 mg by mouth at bedtime.     Flaxseed, Linseed, (FLAXSEED OIL) 1000 MG CAPS Take by mouth daily.     folic acid (FOLVITE) 672 MCG tablet Take 800 mcg by mouth daily.      latanoprost (XALATAN) 0.005 % ophthalmic solution Place 1 drop into both eyes at bedtime.     loratadine (CLARITIN) 10 MG tablet Take 10 mg by mouth daily.     losartan (COZAAR) 100 MG tablet Take 100 mg by mouth daily.     metoprolol succinate (TOPROL-XL) 25 MG 24 hr tablet Take 25 mg by mouth daily.     metoprolol tartrate (LOPRESSOR) 25 MG tablet Take 25 mg by mouth as needed (for HR >100).     niacin (NIASPAN) 1000 MG CR tablet Take 1,000 mg by mouth in the morning and at bedtime.     OVER THE COUNTER MEDICATION Fish oil 1200 mg + D 2000 IU     rosuvastatin (CRESTOR) 10 MG tablet Take 10 mg by mouth daily.     No current facility-administered medications for this visit.    Allergies as of 09/21/2020 - Review Complete 03/16/2020  Allergen Reaction Noted   Anaprox [naproxen]  03/16/2020   Ansaid [flurbiprofen]  03/16/2020   Aspirin  03/16/2020   Lortab [hydrocodone-acetaminophen]  03/16/2020    Vitals: BP (!) 145/72 (BP Location: Right Arm, Patient Position: Sitting)    Pulse 65    Ht 5\' 2"  (1.575 m)    Wt 159 lb (72.1 kg)  BMI 29.08 kg/m  Last Weight:  Wt Readings from Last 1 Encounters:  09/21/20 159 lb (72.1 kg)   Last  Height:   Ht Readings from Last 1 Encounters:  09/21/20 5\' 2"  (1.575 m)     Physical exam: Exam: Neuro: Detailed Neurologic Exam  Speech:    Speech is normal; fluent and spontaneous with normal comprehension.  Cognition:    The patient is oriented to person, place, and time;     recent and remote memory intact;     language fluent;     normal attention, concentration,     fund of knowledge Cranial Nerves:    The pupils are equal, round, and reactive to light. Visual fields are full to finger confrontation. Extraocular movements are intact. Trigeminal sensation is intact and the muscles of mastication are normal. The face is symmetric. The palate elevates in the midline. Hearing intact. Voice is normal. Shoulder shrug is normal. The tongue has normal motion without fasciculations.   Gait:    Slightly antalgic using a cane  Motor Observation:    No asymmetry, no atrophy, and no involuntary movements noted. Tone:    Normal muscle tone.    Posture:    Posture is normal. normal erect    Strength:Left arm proximal weakness otherwise strength is V/V in the upper and lower limbs.      Assessment/Plan:  74y.o. female here as requested by Pomposini, Cherly Anderson, MD for cerebrovascular cerebrovascular accident, she has a past medical history of hyperlipidemia, lipid storage disease, obesity, elevated hemoglobin A1c, hypertension, aortic valve regurgitation, paroxysmal supraventricular tachycardia, cerebrovascular accident, arthritis, sleep apnea, carotid bruit, history of herpes zoster.   I personally reviewed the images, multiple strokes in the left MCA as well as ACA distributions, cardioembolic, continue Eliquis.   I had a long d/w patient about her recent stroke, risk for recurrent stroke/TIAs, personally independently reviewed imaging studies and stroke evaluation results and answered questions.Continue eliquis for secondary stroke prevention and maintain strict control of hypertension  with blood pressure goal below 130/90, diabetes with hemoglobin A1c goal below 6.5% and lipids with LDL cholesterol goal below 70 mg/dL I also advised the patient to eat a healthy diet with plenty of whole grains, cereals, fruits and vegetables, exercise regularly and maintain ideal body weight .Followup in the future with me in 2 months or call earlier if necessary.  PRIOR:  I tried calling Dr. Kotlaba(cardiologist), left a message. Called Dr. Delia Chimes office, she saw Dr. Sammie Bench. Left a message for Dr. Rob Hickman as well(neurology)   No orders of the defined types were placed in this encounter.    Cc: Pomposini, Cherly Anderson, MD   Sarina Ill, MD  [facility] [address] [place], [place] [address]  Phone [phone] Fax [phone]   I spent 30  minutes of face-to-face and non-face-to-face time with patient on the  1. Cardioembolic stroke ([place])    diagnosis.  This included previsit chart review, lab review, study review, order entry, electronic health record documentation, patient education on the different diagnostic and therapeutic options, counseling and coordination of care, risks and benefits of management, compliance, or risk factor reduction

## 2020-09-21 ENCOUNTER — Ambulatory Visit: Payer: Medicare Other | Admitting: Neurology

## 2020-09-21 ENCOUNTER — Encounter: Payer: Self-pay | Admitting: Neurology

## 2020-09-21 ENCOUNTER — Other Ambulatory Visit: Payer: Self-pay

## 2020-09-21 ENCOUNTER — Encounter (INDEPENDENT_AMBULATORY_CARE_PROVIDER_SITE_OTHER): Payer: Self-pay | Admitting: *Deleted

## 2020-09-21 VITALS — BP 145/72 | HR 65 | Ht 62.0 in | Wt 159.0 lb

## 2020-09-21 DIAGNOSIS — I639 Cerebral infarction, unspecified: Secondary | ICD-10-CM

## 2020-09-21 NOTE — Telephone Encounter (Signed)
This encounter was created in error - please disregard.

## 2020-09-21 NOTE — Patient Instructions (Signed)

## 2020-09-22 ENCOUNTER — Telehealth (INDEPENDENT_AMBULATORY_CARE_PROVIDER_SITE_OTHER): Payer: Self-pay | Admitting: *Deleted

## 2020-09-22 NOTE — Telephone Encounter (Signed)
Per Dr Darral Dash patient can stop Eliquis 2 days prior to procedure sch'd 10/20/20

## 2020-10-18 ENCOUNTER — Other Ambulatory Visit: Payer: Self-pay

## 2020-10-18 ENCOUNTER — Other Ambulatory Visit (HOSPITAL_COMMUNITY)
Admission: RE | Admit: 2020-10-18 | Discharge: 2020-10-18 | Disposition: A | Discharge status: Home / Self Care | Payer: Medicare Other | Source: Ambulatory Visit | Attending: Internal Medicine | Admitting: Internal Medicine

## 2020-10-18 DIAGNOSIS — Z01812 Encounter for preprocedural laboratory examination: Secondary | ICD-10-CM | POA: Insufficient documentation

## 2020-10-18 DIAGNOSIS — Z20822 Contact with and (suspected) exposure to covid-19: Secondary | ICD-10-CM | POA: Diagnosis not present

## 2020-10-19 LAB — SARS CORONAVIRUS 2 (TAT 6-24 HRS): SARS Coronavirus 2: NEGATIVE

## 2020-10-20 ENCOUNTER — Ambulatory Visit (HOSPITAL_COMMUNITY)
Admission: RE | Admit: 2020-10-20 | Discharge: 2020-10-20 | Disposition: A | Discharge status: Home / Self Care | Payer: Medicare Other | Attending: Internal Medicine | Admitting: Internal Medicine

## 2020-10-20 ENCOUNTER — Encounter (HOSPITAL_COMMUNITY): Payer: Self-pay | Admitting: Internal Medicine

## 2020-10-20 ENCOUNTER — Other Ambulatory Visit: Payer: Self-pay

## 2020-10-20 ENCOUNTER — Encounter (HOSPITAL_COMMUNITY)
Admission: RE | Disposition: A | Discharge status: Home / Self Care | Payer: Self-pay | Source: Home / Self Care | Attending: Internal Medicine

## 2020-10-20 DIAGNOSIS — Z1211 Encounter for screening for malignant neoplasm of colon: Secondary | ICD-10-CM | POA: Insufficient documentation

## 2020-10-20 DIAGNOSIS — K5730 Diverticulosis of large intestine without perforation or abscess without bleeding: Secondary | ICD-10-CM | POA: Insufficient documentation

## 2020-10-20 DIAGNOSIS — Z8673 Personal history of transient ischemic attack (TIA), and cerebral infarction without residual deficits: Secondary | ICD-10-CM | POA: Insufficient documentation

## 2020-10-20 DIAGNOSIS — Z8371 Family history of colonic polyps: Secondary | ICD-10-CM | POA: Diagnosis not present

## 2020-10-20 DIAGNOSIS — K648 Other hemorrhoids: Secondary | ICD-10-CM | POA: Diagnosis not present

## 2020-10-20 DIAGNOSIS — Z09 Encounter for follow-up examination after completed treatment for conditions other than malignant neoplasm: Secondary | ICD-10-CM | POA: Diagnosis not present

## 2020-10-20 DIAGNOSIS — Z7901 Long term (current) use of anticoagulants: Secondary | ICD-10-CM | POA: Diagnosis not present

## 2020-10-20 DIAGNOSIS — K644 Residual hemorrhoidal skin tags: Secondary | ICD-10-CM | POA: Diagnosis not present

## 2020-10-20 DIAGNOSIS — Z86010 Personal history of colonic polyps: Secondary | ICD-10-CM | POA: Diagnosis not present

## 2020-10-20 DIAGNOSIS — Z79899 Other long term (current) drug therapy: Secondary | ICD-10-CM | POA: Insufficient documentation

## 2020-10-20 HISTORY — PX: COLONOSCOPY: SHX5424

## 2020-10-20 SURGERY — COLONOSCOPY
Anesthesia: Moderate Sedation

## 2020-10-20 MED ORDER — MEPERIDINE HCL 50 MG/ML IJ SOLN
INTRAMUSCULAR | Status: DC | PRN
Start: 2020-10-20 — End: 2020-10-20
  Administered 2020-10-20: 20 mg
  Administered 2020-10-20 (×3): 10 mg

## 2020-10-20 MED ORDER — MIDAZOLAM HCL 5 MG/5ML IJ SOLN
INTRAMUSCULAR | Status: DC | PRN
  Administered 2020-10-20: 2 mg via INTRAVENOUS
  Administered 2020-10-20 (×4): 1 mg via INTRAVENOUS

## 2020-10-20 MED ORDER — STERILE WATER FOR IRRIGATION IR SOLN
Status: DC | PRN
  Administered 2020-10-20: 100 mL

## 2020-10-20 MED ORDER — MEPERIDINE HCL 50 MG/ML IJ SOLN
INTRAMUSCULAR | Status: AC
  Filled 2020-10-20: qty 1

## 2020-10-20 MED ORDER — MIDAZOLAM HCL 5 MG/5ML IJ SOLN
INTRAMUSCULAR | Status: AC
  Filled 2020-10-20: qty 10

## 2020-10-20 MED ORDER — SODIUM CHLORIDE 0.9 % IV SOLN: INTRAVENOUS | Status: DC

## 2020-10-20 NOTE — Op Note (Signed)
[facility] Patient Name: Meghan Keller Procedure Date: 10/20/2020 9:13 AM MRN: [id number] Date of Birth: [birth year] Attending MD: Hildred Laser , MD CSN: [id number] Age: 74 Admit Type: Outpatient Procedure:                Colonoscopy Indications:              High risk colon cancer surveillance: Personal                            history of colonic polyps Providers:                Hildred Laser, MD, Lambert Mody, Janeece Riggers,                            RN, Raphael Gibney, Technician Referring MD:             Cherly Anderson. Pomposini, MD Medicines:                Meperidine 50 mg IV, Midazolam 6 mg IV Complications:            No immediate complications. Estimated Blood Loss:     Estimated blood loss: none. Procedure:                Pre-Anesthesia Assessment:                           - Prior to the procedure, a History and Physical                            was performed, and patient medications and                            allergies were reviewed. The patient's tolerance of                            previous anesthesia was also reviewed. The risks                            and benefits of the procedure and the sedation                            options and risks were discussed with the patient.                            All questions were answered, and informed consent                            was obtained. Prior Anticoagulants: The patient                            last took Eliquis (apixaban) 2 days prior to the                            procedure. ASA Grade Assessment: III - A patient  with severe systemic disease. After reviewing the                            risks and benefits, the patient was deemed in                            satisfactory condition to undergo the procedure.                           After obtaining informed consent, the colonoscope                            was passed under direct vision. Throughout the                             procedure, the patient's blood pressure, pulse, and                            oxygen saturations were monitored continuously. The                            [id number]-[id number] ([id number]) scope was introduced through                            the anus and advanced to the the cecum, identified                            by appendiceal orifice and ileocecal valve. The                            colonoscopy was performed without difficulty. The                            patient tolerated the procedure well. The quality                            of the bowel preparation was good. The ileocecal                            valve, appendiceal orifice, and rectum were                            photographed. Scope In: 9:34:20 AM Scope Out: 9:53:22 AM Scope Withdrawal Time: 0 hours 6 minutes 36 seconds  Total Procedure Duration: 0 hours 19 minutes 2 seconds  Findings:      The perianal and digital rectal examinations were normal.      Multiple diverticula were found in the sigmoid colon.      The exam was otherwise normal throughout the examined colon.      External hemorrhoids were found during retroflexion. The hemorrhoids       were small. Impression:               - Diverticulosis in the sigmoid colon.                           -  External hemorrhoids.                           - No specimens collected. Moderate Sedation:      Moderate (conscious) sedation was administered by the endoscopy nurse       and supervised by the endoscopist. The following parameters were       monitored: oxygen saturation, heart rate, blood pressure, CO2       capnography and response to care. Total physician intraservice time was       25 minutes. Recommendation:           - Patient has a contact number available for                            emergencies. The signs and symptoms of potential                            delayed complications were discussed with the                            patient.  Return to normal activities tomorrow.                            Written discharge instructions were provided to the                            patient.                           - High fiber diet today.                           - Continue present medications.                           - Resume Eliquis (apixaban) today at prior dose.                           - No recommendation at this time regarding repeat                            colonoscopy due to age and no polyps on today's                            exam.                           Comment: Since no polyps found on today's exam as                            well as on her last exam she will not need any more                            surveillance colonoscopies unless her brother has  had more than 10 polyps in which case she should                            come back in 5 years.                           She will let us know. Procedure Code(s):        --- Professional ---                           [id number], Colonoscopy, flexible; diagnostic, including                            collection of specimen(s) by brushing or washing,                            when performed (separate procedure)                           99153, Moderate sedation; each additional 15                            minutes intraservice time                           G0500, Moderate sedation services provided by the                            same physician or other qualified health care                            professional performing a gastrointestinal                            endoscopic service that sedation supports,                            requiring the presence of an independent trained                            observer to assist in the monitoring of the                            patient's level of consciousness and physiological                            status; initial 15 minutes of intra-service time;                             patient age 16 years or older (additional time may                            be reported with [id number], as appropriate) Diagnosis Code(s):        --- Professional ---  Z86.010, Personal history of colonic polyps                           K64.4, Residual hemorrhoidal skin tags                           K57.30, Diverticulosis of large intestine without                            perforation or abscess without bleeding CPT copyright 2019 [employer] Association. All rights reserved. The codes documented in this report are preliminary and upon coder review may  be revised to meet current compliance requirements. Hildred Laser, MD Hildred Laser, MD 10/20/2020 10:04:52 AM This report has been signed electronically. Number of Addenda: 0

## 2020-10-20 NOTE — Discharge Instructions (Signed)
Resume aspirin and Eliquis as before Resume other medications as before. High-fiber diet. No driving for 24 hours. No more colonoscopies unless your brother has had more than 10 polyps.  PATIENT INSTRUCTIONS POST-ANESTHESIA  IMMEDIATELY FOLLOWING SURGERY:  Do not drive or operate machinery for the first twenty four hours after surgery.  Do not make any important decisions for twenty four hours after surgery or while taking narcotic pain medications or sedatives.  If you develop intractable nausea and vomiting or a severe headache please notify your doctor immediately.  FOLLOW-UP:  Please make an appointment with your surgeon as instructed. You do not need to follow up with anesthesia unless specifically instructed to do so.  WOUND CARE INSTRUCTIONS (if applicable):  Keep a dry clean dressing on the anesthesia/puncture wound site if there is drainage.  Once the wound has quit draining you may leave it open to air.  Generally you should leave the bandage intact for twenty four hours unless there is drainage.  If the epidural site drains for more than 36-48 hours please call the anesthesia department.  QUESTIONS?:  Please feel free to call your physician or the hospital operator if you have any questions, and they will be happy to assist you.      Colonoscopy, Adult, Care After This sheet gives you information about how to care for yourself after your procedure. Your doctor may also give you more specific instructions. If you have problems or questions, call your doctor. What can I expect after the procedure? After the procedure, it is common to have:  A small amount of blood in your poop (stool) for 24 hours.  Some gas.  Mild cramping or bloating in your belly (abdomen). Follow these instructions at home: Eating and drinking   Drink enough fluid to keep your pee (urine) pale yellow.  Follow instructions from your doctor about what you cannot eat or drink.  Return to your normal diet  as told by your doctor. Avoid heavy or fried foods that are hard to digest. Activity  Rest as told by your doctor.  Do not sit for a long time without moving. Get up to take short walks every 1-2 hours. This is important. Ask for help if you feel weak or unsteady.  Return to your normal activities as told by your doctor. Ask your doctor what activities are safe for you. To help cramping and bloating:   Try walking around.  Put heat on your belly as told by your doctor. Use the heat source that your doctor recommends, such as a moist heat pack or a heating pad. ? Put a towel between your skin and the heat source. ? Leave the heat on for 20-30 minutes. ? Remove the heat if your skin turns bright red. This is very important if you are unable to feel pain, heat, or cold. You may have a greater risk of getting burned. General instructions  For the first 24 hours after the procedure: ? Do not drive or use machinery. ? Do not sign important documents. ? Do not drink alcohol. ? Do your daily activities more slowly than normal. ? Eat foods that are soft and easy to digest.  Take over-the-counter or prescription medicines only as told by your doctor.  Keep all follow-up visits as told by your doctor. This is important. Contact a doctor if:  You have blood in your poop 2-3 days after the procedure. Get help right away if:  You have more than a small amount  of blood in your poop.  You see large clumps of tissue (blood clots) in your poop.  Your belly is swollen.  You feel like you may vomit (nauseous).  You vomit.  You have a fever.  You have belly pain that gets worse, and medicine does not help your pain. Summary  After the procedure, it is common to have a small amount of blood in your poop. You may also have mild cramping and bloating in your belly.  For the first 24 hours after the procedure, do not drive or use machinery, do not sign important documents, and do not drink  alcohol.  Get help right away if you have a lot of blood in your poop, feel like you may vomit, have a fever, or have more belly pain. This information is not intended to replace advice given to you by your health care provider. Make sure you discuss any questions you have with your health care provider. Document Revised: 06/29/2019 Document Reviewed: 06/29/2019 Elsevier Patient Education  [place].   Diverticulosis  Diverticulosis is a condition that develops when small pouches (diverticula) form in the wall of the large intestine (colon). The colon is where water is absorbed and stool (feces) is formed. The pouches form when the inside layer of the colon pushes through weak spots in the outer layers of the colon. You may have a few pouches or many of them. The pouches usually do not cause problems unless they become inflamed or infected. When this happens, the condition is called diverticulitis. What are the causes? The cause of this condition is not known. What increases the risk? The following factors may make you more likely to develop this condition:  Being older than age 35. Your risk for this condition increases with age. Diverticulosis is rare among people younger than age 73. By age 88, many people have it.  Eating a low-fiber diet.  Having frequent constipation.  Being overweight.  Not getting enough exercise.  Smoking.  Taking over-the-counter pain medicines, like aspirin and ibuprofen.  Having a family history of diverticulosis. What are the signs or symptoms? In most people, there are no symptoms of this condition. If you do have symptoms, they may include:  Bloating.  Cramps in the abdomen.  Constipation or diarrhea.  Pain in the lower left side of the abdomen. How is this diagnosed? Because diverticulosis usually has no symptoms, it is most often diagnosed during an exam for other colon problems. The condition may be diagnosed by:  Using a  flexible scope to examine the colon (colonoscopy).  Taking an X-ray of the colon after dye has been put into the colon (barium enema).  Having a CT scan. How is this treated? You may not need treatment for this condition. Your health care provider may recommend treatment to prevent problems. You may need treatment if you have symptoms or if you previously had diverticulitis. Treatment may include:  Eating a high-fiber diet.  Taking a fiber supplement.  Taking a live bacteria supplement (probiotic).  Taking medicine to relax your colon. Follow these instructions at home: Medicines  Take over-the-counter and prescription medicines only as told by your health care provider.  If told by your health care provider, take a fiber supplement or probiotic. Constipation prevention Your condition may cause constipation. To prevent or treat constipation, you may need to:  Drink enough fluid to keep your urine pale yellow.  Take over-the-counter or prescription medicines.  Eat foods that are high in  fiber, such as beans, whole grains, and fresh fruits and vegetables.  Limit foods that are high in fat and processed sugars, such as fried or sweet foods.  General instructions  Try not to strain when you have a bowel movement.  Keep all follow-up visits as told by your health care provider. This is important. Contact a health care provider if you:  Have pain in your abdomen.  Have bloating.  Have cramps.  Have not had a bowel movement in 3 days. Get help right away if:  Your pain gets worse.  Your bloating becomes very bad.  You have a fever or chills, and your symptoms suddenly get worse.  You vomit.  You have bowel movements that are bloody or black.  You have bleeding from your rectum. Summary  Diverticulosis is a condition that develops when small pouches (diverticula) form in the wall of the large intestine (colon).  You may have a few pouches or many of  them.  This condition is most often diagnosed during an exam for other colon problems.  Treatment may include increasing the fiber in your diet, taking supplements, or taking medicines. This information is not intended to replace advice given to you by your health care provider. Make sure you discuss any questions you have with your health care provider. Document Revised: 07/02/2019 Document Reviewed: 07/02/2019 Elsevier Patient Education  [place].    Hemorrhoids Hemorrhoids are swollen veins that may develop:  In the butt (rectum). These are called internal hemorrhoids.  Around the opening of the butt (anus). These are called external hemorrhoids. Hemorrhoids can cause pain, itching, or bleeding. Most of the time, they do not cause serious problems. They usually get better with diet changes, lifestyle changes, and other home treatments. What are the causes? This condition may be caused by:  Having trouble pooping (constipation).  Pushing hard (straining) to poop.  Watery poop (diarrhea).  Pregnancy.  Being very overweight (obese).  Sitting for long periods of time.  Heavy lifting or other activity that causes you to strain.  Anal sex.  Riding a bike for a long period of time. What are the signs or symptoms? Symptoms of this condition include:  Pain.  Itching or soreness in the butt.  Bleeding from the butt.  Leaking poop.  Swelling in the area.  One or more lumps around the opening of your butt. How is this diagnosed? A doctor can often diagnose this condition by looking at the affected area. The doctor may also:  Do an exam that involves feeling the area with a gloved hand (digital rectal exam).  Examine the area inside your butt using a small tube (anoscope).  Order blood tests. This may be done if you have lost a lot of blood.  Have you get a test that involves looking inside the colon using a flexible tube with a camera on the end  (sigmoidoscopy or colonoscopy). How is this treated? This condition can usually be treated at home. Your doctor may tell you to change what you eat, make lifestyle changes, or try home treatments. If these do not help, procedures can be done to remove the hemorrhoids or make them smaller. These may involve:  Placing rubber bands at the base of the hemorrhoids to cut off their blood supply.  Injecting medicine into the hemorrhoids to shrink them.  Shining a type of light energy onto the hemorrhoids to cause them to fall off.  Doing surgery to remove the hemorrhoids or cut  off their blood supply. Follow these instructions at home: Eating and drinking   Eat foods that have a lot of fiber in them. These include whole grains, beans, nuts, fruits, and vegetables.  Ask your doctor about taking products that have added fiber (fibersupplements).  Reduce the amount of fat in your diet. You can do this by: ? Eating low-fat dairy products. ? Eating less red meat. ? Avoiding processed foods.  Drink enough fluid to keep your pee (urine) pale yellow. Managing pain and swelling   Take a warm-water bath (sitz bath) for 20 minutes to ease pain. Do this 3-4 times a day. You may do this in a bathtub or using a portable sitz bath that fits over the toilet.  If told, put ice on the painful area. It may be helpful to use ice between your warm baths. ? Put ice in a plastic bag. ? Place a towel between your skin and the bag. ? Leave the ice on for 20 minutes, 2-3 times a day. General instructions  Take over-the-counter and prescription medicines only as told by your doctor. ? Medicated creams and medicines may be used as told.  Exercise often. Ask your doctor how much and what kind of exercise is best for you.  Go to the bathroom when you have the urge to poop. Do not wait.  Avoid pushing too hard when you poop.  Keep your butt dry and clean. Use wet toilet paper or moist towelettes after  pooping.  Do not sit on the toilet for a long time.  Keep all follow-up visits as told by your doctor. This is important. Contact a doctor if you:  Have pain and swelling that do not get better with treatment or medicine.  Have trouble pooping.  Cannot poop.  Have pain or swelling outside the area of the hemorrhoids. Get help right away if you have:  Bleeding that will not stop. Summary  Hemorrhoids are swollen veins in the butt or around the opening of the butt.  They can cause pain, itching, or bleeding.  Eat foods that have a lot of fiber in them. These include whole grains, beans, nuts, fruits, and vegetables.  Take a warm-water bath (sitz bath) for 20 minutes to ease pain. Do this 3-4 times a day. This information is not intended to replace advice given to you by your health care provider. Make sure you discuss any questions you have with your health care provider. Document Revised: 12/11/2018 Document Reviewed: 04/24/2018 Elsevier Patient Education  [place].

## 2020-10-20 NOTE — H&P (Signed)
Meghan Keller is an 74 y.o. female.   Chief Complaint: Patient is here for colonoscopy. HPI: Patient is 74 year old Caucasian female with history of colonic adenomas and is here for surveillance colonoscopy.  She did not have any on her last exam of April 2015 but had on prior exam.  She denies abdominal pain change in bowel habits or rectal bleeding.  Her appetite is good and her weight has been stable. She has a younger brother who has had polyps removed as well.  She is not sure if he has had more than 10 polyps removed. Family history is negative for CRC.  Past Medical History:  Diagnosis Date  . Adenomatous polyp    ascending colon  . Aortic valve regurgitation    "mild"  . Arthritis   . Atrial flutter ([place])   . Benign essential hypertension   . Blood in urine 2017  . Carotid bruit   . CVA (cerebral vascular accident) ([place]) 01/2020  . Diverticular disease   . Fibrocystic disease of breast   . GERD with esophagitis   . Hyperglycemia   . Hyperlipidemia   . Lipid storage disease   . Obesity   . Osteopenia   . PSVT (paroxysmal supraventricular tachycardia) ([place])   . Sleep apnea    "mild", was on CPAP before stroke, pt states she needs to get back on it   . Wide-angle glaucoma     Past Surgical History:  Procedure Laterality Date  . BREAST BIOPSY Left 12/2007   Dr Audrie Lia- fibrocystic Fibroadenoma. pt reports this was benign both times  . rotator cuff surgery Right 2006    Family History  Problem Relation Age of Onset  . Bladder Cancer Mother   . Leukemia Mother   . Hypertension Mother   . Other Mother        smoked  . Esophageal cancer Father   . Hypertension Father   . Atrial fibrillation Father   . Other Father        smoked  . High Cholesterol Father   . Glaucoma Father   . Hypertension Brother   . Heart attack Brother   . Hypertension Brother   . Atrial fibrillation Brother        heart ablation  . High Cholesterol Brother   . Stroke Paternal  Grandfather        pt believes he had one in his 67s    Social History:  reports that she has never smoked. She has never used smokeless tobacco. She reports that she does not drink alcohol and does not use drugs.  Allergies:  Allergies  Allergen Reactions  . Anaprox [Naproxen] Other (See Comments)    Stomach upset  . Ansaid [Flurbiprofen] Other (See Comments)    Stomach upset  . Aspirin     "I can tolerate 81 mg"  . Lortab [Hydrocodone-Acetaminophen] Other (See Comments)    Upset Stomach  . Simvastatin Other (See Comments)    Leg pain     Medications Prior to Admission  Medication Sig Dispense Refill  . Calcium Carb-Cholecalciferol (CALCIUM 600+D) 600-800 MG-UNIT TABS Take 1 tablet by mouth daily.     . Cyanocobalamin (VITAMIN B12) 500 MCG TABS Take 500 mcg by mouth daily.     Marland Kitchen ELIQUIS 5 MG TABS tablet Take 5 mg by mouth 2 (two) times daily.    Marland Kitchen esomeprazole (NEXIUM) 40 MG capsule Take 40 mg by mouth daily.    . famotidine (PEPCID) 20 MG tablet  Take 20 mg by mouth at bedtime.    . Flaxseed, Linseed, (FLAXSEED OIL) 1000 MG CAPS Take 1,000 mg by mouth daily.     . folic acid (FOLVITE) 981 MCG tablet Take 800 mcg by mouth daily.     Marland Kitchen latanoprost (XALATAN) 0.005 % ophthalmic solution Place 1 drop into both eyes at bedtime.    Marland Kitchen loratadine (CLARITIN) 10 MG tablet Take 10 mg by mouth daily.    Marland Kitchen losartan (COZAAR) 100 MG tablet Take 100 mg by mouth daily.    . metoprolol succinate (TOPROL-XL) 25 MG 24 hr tablet Take 25 mg by mouth daily.    . metoprolol tartrate (LOPRESSOR) 25 MG tablet Take 25 mg by mouth as needed (for HR >100).    . niacin (NIASPAN) 1000 MG CR tablet Take 1,000 mg by mouth in the morning and at bedtime.    . Omega-3 Fatty Acids (OMEGA-3 FISH OIL) 1200 MG CAPS Take 1,200 mg by mouth daily.    Marland Kitchen pyridOXINE (VITAMIN B-6) 50 MG tablet Take 50 mg by mouth daily.    . rosuvastatin (CRESTOR) 10 MG tablet Take 10 mg by mouth at bedtime.       Results for orders  placed or performed during the hospital encounter of 10/18/20 (from the past 48 hour(s))  SARS CORONAVIRUS 2 (TAT 6-24 HRS) Nasopharyngeal Nasopharyngeal Swab     Status: None   Collection Time: 10/18/20  2:30 PM   Specimen: Nasopharyngeal Swab  Result Value Ref Range   SARS Coronavirus 2 NEGATIVE NEGATIVE    Comment: (NOTE) SARS-CoV-2 target nucleic acids are NOT DETECTED.  The SARS-CoV-2 RNA is generally detectable in upper and lower respiratory specimens during the acute phase of infection. Negative results do not preclude SARS-CoV-2 infection, do not rule out co-infections with other pathogens, and should not be used as the sole basis for treatment or other patient management decisions. Negative results must be combined with clinical observations, patient history, and epidemiological information. The expected result is Negative.  Fact Sheet for Patients: SugarRoll.be  Fact Sheet for Healthcare Providers: https://www.woods-mathews.com/  This test is not yet approved or cleared by the [place] FDA and  has been authorized for detection and/or diagnosis of SARS-CoV-2 by FDA under an Emergency Use Authorization (EUA). This EUA will remain  in effect (meaning this test can be used) for the duration of the COVID-19 declaration under Se ction 564(b)(1) of the Act, 21 U.S.C. section 360bbb-3(b)(1), unless the authorization is terminated or revoked sooner.  Performed at [facility], [place] [address] [place], [place] [address]    No results found.  Review of Systems  Blood pressure (!) 137/58, pulse 75, temperature 97.7 F (36.5 C), temperature source Oral, resp. rate 16, height 5\' 2"  (1.575 m), weight 72.1 kg, SpO2 100 %. Physical Exam HENT:     Mouth/Throat:     Mouth: Mucous membranes are moist.     Pharynx: Oropharynx is clear.  Eyes:     General: No scleral icterus.    Conjunctiva/sclera: Conjunctivae normal.   Cardiovascular:     Rate and Rhythm: Normal rate and regular rhythm.     Heart sounds: Normal heart sounds. No murmur heard.   Pulmonary:     Effort: Pulmonary effort is normal.     Breath sounds: Normal breath sounds.  Abdominal:     General: Abdomen is flat. There is no distension.     Palpations: There is no mass.     Tenderness:  There is no abdominal tenderness.  Musculoskeletal:        General: No swelling.     Cervical back: Neck supple. No rigidity.  Skin:    General: Skin is warm and dry.  Neurological:     Mental Status: She is alert.      Assessment/Plan  History of colonic polyps. Surveillance colonoscopy.  Hildred Laser, MD 10/20/2020, 9:25 AM

## 2020-10-26 ENCOUNTER — Encounter (HOSPITAL_COMMUNITY): Payer: Self-pay | Admitting: Internal Medicine

## 2022-09-20 ENCOUNTER — Ambulatory Visit: Payer: Medicare Other | Admitting: Neurology

## 2022-09-20 ENCOUNTER — Encounter: Payer: Self-pay | Admitting: Neurology

## 2022-09-20 VITALS — BP 150/68 | HR 6 | Ht 63.0 in | Wt 160.0 lb

## 2022-09-20 DIAGNOSIS — Z8673 Personal history of transient ischemic attack (TIA), and cerebral infarction without residual deficits: Secondary | ICD-10-CM | POA: Diagnosis not present

## 2022-09-20 DIAGNOSIS — G4730 Sleep apnea, unspecified: Secondary | ICD-10-CM | POA: Diagnosis not present

## 2022-09-20 DIAGNOSIS — I480 Paroxysmal atrial fibrillation: Secondary | ICD-10-CM | POA: Diagnosis not present

## 2022-09-20 DIAGNOSIS — K219 Gastro-esophageal reflux disease without esophagitis: Secondary | ICD-10-CM | POA: Insufficient documentation

## 2022-09-20 DIAGNOSIS — R0681 Apnea, not elsewhere classified: Secondary | ICD-10-CM

## 2022-09-20 DIAGNOSIS — Z91199 Patient's noncompliance with other medical treatment and regimen due to unspecified reason: Secondary | ICD-10-CM | POA: Insufficient documentation

## 2022-09-20 NOTE — Patient Instructions (Signed)
Healthy Living: Sleep In this video, you will learn why sleep is an important part of a healthy lifestyle. To view the content, go to this web address: [email or web address]  This video will expire on: 06/08/2024. If you need access to this video following this date, please reach out to the healthcare provider who assigned it to you. This information is not intended to replace advice given to you by your health care provider. Make sure you discuss any questions you have with your health care provider. Elsevier Patient Education  [place].

## 2022-09-20 NOTE — Progress Notes (Signed)
SLEEP MEDICINE CLINIC    Provider:  Larey Seat, MD  Primary Care Physician:  Pomposini, Cherly Anderson, MD No address on file     Referring Provider: Pomposini, Cherly Anderson, Md No address on file          Chief Complaint according to patient   Patient presents with:     New Patient (Initial Visit)      Presents to established sleep apnea care. Last SS over 5 yrs ago and she is eligible for a new machine. Current CPAP set up 06/2017. States she sometimes doesn't feel like she is getting enough air and that she takes off in middle of the night not aware. [facility]. Has  atrial fib hx of stroke and her cardiologist wanted her to be revaluated"       HISTORY OF PRESENT ILLNESS:  Meghan Keller is a 76 y.o. year old White or Caucasian female patient seen here as a referral on 09/20/2022 from Dr Harmon Pier for a sleep consultation. The patient presents here on 20 September 2022 as a new patient to the sleep clinic.  She reports that 5-1/2 years ago her primary care physician had ordered a home sleep test for her and based on those results she was set up with an O2 titration CPAP.Marland Kitchen  The her medical equipment company is in [facility] home health.  She was set up on 06-27-2017.   She has not always been able to comply with the CPAP regimen- but looking at her last 90 days she has made a valid attempt she has used the machine 89 out of 90 days but only 35 days was she able to use it over 4 hours.  So the average user time is between 3 and 4 hours at night the machine is an AutoSet 10 ResMed machine with a minimum pressure of 5 and maximum pressure of 15 and an expiratory relief set at 2 cm of water pressure.  She does have moderate air leaks at the 95th percentile 30.4 L/min she does have a 95th percentile pressure of 12.9 cm water so roughly this is 13 cm water and she is struggling against the upper setting of her CPAP.  There are no significant central apneas arising.  The  residual AHI in October 2023 is 2.1/h so the reduction in apnea seems to be sufficient.  It is a problem to tolerate the CPAP.  Patient endorsed at 2 points out of 15 on a geriatric depression scale which is not clinically significant, her Epworth sleep sleepiness score was endorsed at only 1.2 today for her falling asleep when watching television.  Her fatigue severity scale is endorsed at 26 out of 63 points which is also not significant.    She suffered atrial fibrillation, embolic strokes in [id number], from there on she followed with Dr Jaynee Eagles.    " can't use CPAP all  night  but my cardiologists wants me to, Dr Jaynee Eagles also urged me after my stroke- I am left with residual let numbness."     Meghan Keller  has a past medical history of Adenomatous polyp, Aortic valve regurgitation, Arthritis, Atrial flutter ([place]), Benign essential hypertension, Blood in urine (2017), Carotid bruit, CVA (cerebral vascular accident) ([place]) (01/2020), Diverticular disease, Fibrocystic disease of breast, GERD with esophagitis, Hyperglycemia, Hyperlipidemia, Lipid storage disease, Obesity, Osteopenia, PSVT (paroxysmal supraventricular tachycardia), Sleep apnea, and Wide-angle glaucoma.   The patient had her HST through PCP in [place]  in  the year 2018  with a result of an mild OSA, as she reported.    Sleep relevant medical history: nasal congestion, dysphonia, GERD , no dysphagia, one time nocturia, atrial fib, embolic strokes, normal BMI.   Family medical /sleep history: Bother on CPAP with OSA, mother was  likely affected too.   Social history:  Patient is retired from Therapist, sports in [employer].  and lives in a household with spouse, 2 adult children,  The patient used to work in shifts( Presenter, broadcasting,) a long time ago. Tobacco use; never .  ETOH use ; never ,  Caffeine intake in form of Coffee( 2 cups in AM ) Soda( /) Tea ( /) or energy drinks. Regular exercise in form of walking 1-3 miles. Core strength. .        Sleep  habits are as follows: The patient's dinner time is between 5 PM. The patient goes to bed at 9-10 PM and initiates sleep within 30 minutes, after reading in bed- continues to sleep for 3-4 hours, wakes for a bathroom breaks, the first time at 3.30 AM.   That's when the CPAP is often not used again.  The preferred sleep position is right , prone , with the support of 1-2 pillows. She likes to sleep slightly elevated -GERD related.  Dreams are reportedly infrequent/vivid.  6 .30 AM is the usual rise time. The patient wakes up spontaneously at 6 AM.   She reports mostly feeling refreshed or restored in AM, with symptoms such as dry mouth, morning headaches, and residual fatigue.  Naps are taken infrequently, l and were often less refreshing.    Review of Systems: Out of a complete 14 system review, the patient complains of only the following symptoms, and all other reviewed systems are negative.:  Fatigue, sleepiness , snoring, fragmented sleep-   Sudden fatigue ( atrial fib) , no cardioversion or ablation. Flecainide.     How likely are you to doze in the following situations: 0 = not likely, 1 = slight chance, 2 = moderate chance, 3 = high chance   Sitting and Reading? Watching Television? Sitting inactive in a public place (theater or meeting)? As a passenger in a car for an hour without a break? Lying down in the afternoon when circumstances permit? Sitting and talking to someone? Sitting quietly after lunch without alcohol? In a car, while stopped for a few minutes in traffic?   Total = 1/ 24 points   FSS endorsed at 26/ 63 points.  GDS at 2/ 15 points.   Social History   Socioeconomic History   Marital status: Married    Spouse name: Not on file   Number of children: 2   Years of education: 12+   Highest education level: Not on file  Occupational History   Not on file  Tobacco Use   Smoking status: Never   Smokeless tobacco: Never  Vaping Use   Vaping Use: Never used   Substance and Sexual Activity   Alcohol use: Never   Drug use: Never   Sexual activity: Not on file  Other Topics Concern   Not on file  Social History Narrative   Lives at home with husband   Right handed   Caffeine: 2 cups/day    Social Determinants of Health   Financial Resource Strain: Not on file  Food Insecurity: Not on file  Transportation Needs: Not on file  Physical Activity: Not on file  Stress: Not on file  Social Connections:  Not on file    Family History  Problem Relation Age of Onset   Bladder Cancer Mother    Leukemia Mother    Hypertension Mother    Other Mother        smoked   Esophageal cancer Father    Hypertension Father    Atrial fibrillation Father    Other Father        smoked   High Cholesterol Father    Glaucoma Father    Hypertension Brother    Heart attack Brother    Hypertension Brother    Atrial fibrillation Brother        heart ablation   High Cholesterol Brother    Stroke Paternal Grandfather        pt believes he had one in his 85s     Past Medical History:  Diagnosis Date   Adenomatous polyp    ascending colon   Aortic valve regurgitation    "mild"   Arthritis    Atrial flutter (HCC)    Benign essential hypertension    Blood in urine 2017   Carotid bruit    CVA (cerebral vascular accident) ([place]) 01/2020   Diverticular disease    Fibrocystic disease of breast    GERD with esophagitis    Hyperglycemia    Hyperlipidemia    Lipid storage disease    Obesity    Osteopenia    PSVT (paroxysmal supraventricular tachycardia)    Sleep apnea    "mild", was on CPAP before stroke, pt states she needs to get back on it    Wide-angle glaucoma     Past Surgical History:  Procedure Laterality Date   BREAST BIOPSY Left 12/2007   Dr Audrie Lia- fibrocystic Fibroadenoma. pt reports this was benign both times   COLONOSCOPY N/A 10/20/2020   Procedure: COLONOSCOPY;  Surgeon: Rogene Houston, MD;  Location: [facility];  Service:  Endoscopy;  Laterality: N/A;  930   rotator cuff surgery Right 2006     Current Outpatient Medications on File Prior to Visit  Medication Sig Dispense Refill   Calcium Carb-Cholecalciferol (CALCIUM 600+D) 600-800 MG-UNIT TABS Take 1 tablet by mouth daily.      Cyanocobalamin (VITAMIN B12) 500 MCG TABS Take 500 mcg by mouth daily.      ELIQUIS 5 MG TABS tablet Take 5 mg by mouth 2 (two) times daily.     famotidine (PEPCID) 20 MG tablet Take 20 mg by mouth at bedtime.     Flaxseed, Linseed, (FLAXSEED OIL) 1000 MG CAPS Take 1,000 mg by mouth daily.      flecainide (TAMBOCOR) 50 MG tablet Take 50 mg by mouth in the morning and at bedtime.     folic acid (FOLVITE) 329 MCG tablet Take 800 mcg by mouth daily.      latanoprost (XALATAN) 0.005 % ophthalmic solution Place 1 drop into both eyes at bedtime.     loratadine (CLARITIN) 10 MG tablet Take 10 mg by mouth daily.     metoprolol succinate (TOPROL-XL) 25 MG 24 hr tablet Take 25 mg by mouth daily.     metoprolol tartrate (LOPRESSOR) 25 MG tablet Take 25 mg by mouth as needed (for HR >100).     niacin (NIASPAN) 1000 MG CR tablet Take 1,000 mg by mouth in the morning and at bedtime.     Omega-3 Fatty Acids (OMEGA-3 FISH OIL) 1200 MG CAPS Take 1,200 mg by mouth daily.     pantoprazole (PROTONIX) 40 MG tablet  Take 40 mg by mouth daily.     pyridOXINE (VITAMIN B-6) 50 MG tablet Take 50 mg by mouth daily.     rosuvastatin (CRESTOR) 10 MG tablet Take 10 mg by mouth at bedtime.      No current facility-administered medications on file prior to visit.    Allergies  Allergen Reactions   Anaprox [Naproxen] Other (See Comments)    Stomach upset   Ansaid [Flurbiprofen] Other (See Comments)    Stomach upset   Aspirin     "I can tolerate 81 mg"   Lortab [Hydrocodone-Acetaminophen] Other (See Comments)    Upset Stomach   Simvastatin Other (See Comments)    Leg pain     Physical exam:  Today's Vitals   09/20/22 1009  BP: (!) 150/68  Pulse: (!)  6  Weight: 160 lb (72.6 kg)  Height: '5\' 3"'$  (1.6 m)   Body mass index is 28.34 kg/m.   Wt Readings from Last 3 Encounters:  09/20/22 160 lb (72.6 kg)  10/20/20 159 lb (72.1 kg)  09/21/20 159 lb (72.1 kg)     Ht Readings from Last 3 Encounters:  09/20/22 '5\' 3"'$  (1.6 m)  10/20/20 '5\' 2"'$  (1.575 m)  09/21/20 '5\' 2"'$  (1.575 m)      General: The patient is awake, alert and appears not in acute distress. The patient is well groomed. Head: Normocephalic, atraumatic.  Neck is supple. Mallampati 1,  neck circumference:13. 5 inches . Nasal airflow  patent.  Retrognathia is not seen.  Dental status:  biological  Cardiovascular: regular rate by radial pulse-  without distended neck veins. Respiratory: Lungs are clear to auscultation.  Skin:  With evidence of ankle edema, or rash. Trunk: The patient's posture is erect.   Neurologic exam : The patient is awake and alert, oriented to place and time.   Memory subjective described as intact.  Attention span & concentration ability appears normal.  Speech is fluent,  without  dysarthria, dysphonia or aphasia.  Mood and affect are appropriate.   Cranial nerves: no loss of smell or taste reported  Pupils are equal and briskly reactive to light. Funduscopic exam deferred. .  Extraocular movements in vertical and horizontal planes were intact and without nystagmus. No Diplopia. Visual fields by finger perimetry are intact. Hearing was intact to soft voice and finger rubbing.    Facial sensation intact to fine touch.  Facial motor strength is symmetric and tongue and uvula move midline.  Neck ROM : rotation, tilt and flexion extension were normal for age and shoulder shrug was symmetrical.    Motor exam:  Symmetric bulk, tone and ROM.   Normal tone without cog -wheeling, left grip is weaker. .   Sensory:  Fine touch, pinprick and vibration were normal.  Proprioception tested in the upper extremities was normal.   Coordination: Rapid alternating  movements in the fingers/hands were of normal speed.  The Finger-to-nose maneuver was intact without evidence of ataxia, dysmetria or tremor.   Gait and station: Patient could rise unassisted from a seated position, walked without assistive device.  Stance is of normal width/ base and the patient turned with 3.5 steps.  Toe and heel walk were deferred.  Deep tendon reflexes: in the left upper and lower extremities are more brisk, slight spasticity. Babinski response was deferred.       After spending a total time of  40  minutes face to face and additional time for physical and neurologic examination, review of laboratory studies,  personal review of imaging studies, reports and results of other testing and review of referral information / records as far as provided in visit, I have established the following assessments:   I do think that there is still a component of diastolic heart failure given her tendency to have ankle edema.  Her stroke which affected the left body side has not led to a change of tongue or uvula movements. Her reported acid reflux disease can also contribute to apnea. Left carotid stenosis.   1) she is a compliant CPAP user by days, not hours.  She has a low residual AHI on current machine, and she uses a nasal pillow.   2) atrial fib  is paroxysmal and strongly correlated to Sleep apnea.   3) no anatomical risk factors. HTN.   My Plan is to proceed with: attended PSG, patient uses nasal pillows at home, prone sleeper.   1)As I don't know her baseline, I wonder if her baseline which was established before we knew about atrial fibrillation may have significantly changed.  We have documentation of cardioembolic strokes related to atrial fib, she has started on medication to control heart rate, and she still has sometimes the feeling of not getting enough air.   So I like to obtain a baseline by in lab sleep study to have cardiac rhythm data.  A home sleep test will only  give me cardiac rate data.  And based on that result we should set her up for a new CPAP BiPAP or may be no PAP therapy at all.     I would like to thank Sarina Ill, MD  and Pomposini, Cherly Anderson, Md No address on file for allowing me to meet with and to take care of this pleasant patient.   I plan to follow up either personally or through our NP within 2-4 months.   CC: I will share my notes with PCP.  Electronically signed by: Larey Seat, MD 09/20/2022 10:15 AM  [facility] Board certified by The [employer] of Sleep Medicine and Diplomate of the [employer] of Sleep Medicine. Board certified In Neurology through the [place], Fellow of the [employer] of Neurology. Medical Director of [facility].    Presents to established sleep apnea care. Last SS over 5 yrs ago and she is eligible for a new machine. Current CPAP set up 06/2017. States she sometimes doesn't feel like she is getting enough air and that she takes off in middle of the night not aware. [facility]. Has hx of stroke and her cardiologist wanted her to be revaluated

## 2022-10-01 ENCOUNTER — Encounter: Payer: Self-pay | Admitting: Neurology

## 2022-10-03 ENCOUNTER — Telehealth: Payer: Self-pay | Admitting: Neurology

## 2022-10-03 DIAGNOSIS — Z8673 Personal history of transient ischemic attack (TIA), and cerebral infarction without residual deficits: Secondary | ICD-10-CM

## 2022-10-03 DIAGNOSIS — I480 Paroxysmal atrial fibrillation: Secondary | ICD-10-CM

## 2022-10-03 DIAGNOSIS — K219 Gastro-esophageal reflux disease without esophagitis: Secondary | ICD-10-CM

## 2022-10-03 DIAGNOSIS — G4730 Sleep apnea, unspecified: Secondary | ICD-10-CM

## 2022-10-03 DIAGNOSIS — Z91199 Patient's noncompliance with other medical treatment and regimen due to unspecified reason: Secondary | ICD-10-CM

## 2022-10-03 NOTE — Telephone Encounter (Signed)
[employer] Medicare pending faxed notes

## 2022-10-25 NOTE — Telephone Encounter (Signed)
[employer] denied the NPSG..  Can a HST be order?

## 2022-10-31 NOTE — Telephone Encounter (Signed)
Noted, submitted everything for the HST.

## 2022-11-01 NOTE — Telephone Encounter (Signed)
HST- [employer] medicare no auth req via fax form.  Patient is scheduled at [facility] for 11/27/22 at 9:30 AM  Mailed packet to the patient.

## 2022-11-26 NOTE — Telephone Encounter (Signed)
Patient called and stated she is sick. She r/s her HST for 12/11/22 at 10:15 AM. Mailed new packet to the patient.

## 2022-12-11 ENCOUNTER — Ambulatory Visit (INDEPENDENT_AMBULATORY_CARE_PROVIDER_SITE_OTHER): Payer: Medicare Other | Admitting: Neurology

## 2022-12-11 DIAGNOSIS — Z8673 Personal history of transient ischemic attack (TIA), and cerebral infarction without residual deficits: Secondary | ICD-10-CM

## 2022-12-11 DIAGNOSIS — G4733 Obstructive sleep apnea (adult) (pediatric): Secondary | ICD-10-CM

## 2022-12-11 DIAGNOSIS — Z91199 Patient's noncompliance with other medical treatment and regimen due to unspecified reason: Secondary | ICD-10-CM

## 2022-12-11 DIAGNOSIS — G4730 Sleep apnea, unspecified: Secondary | ICD-10-CM

## 2022-12-11 DIAGNOSIS — I480 Paroxysmal atrial fibrillation: Secondary | ICD-10-CM

## 2022-12-11 DIAGNOSIS — K219 Gastro-esophageal reflux disease without esophagitis: Secondary | ICD-10-CM

## 2022-12-12 NOTE — Progress Notes (Unsigned)
[facility]  HOME SLEEP TEST (Watch PAT) REPORT   STUDY DATE: 12/11/2022  DOB: 10-Mar-1946  MRN: [id number]  ORDERING CLINICIAN: Huston Foley, MD, PhD - study interpreted on behalf of Dr. Vickey Huger   REFERRING CLINICIAN: Pomposini, Rande Brunt, MD (PCP), Dr. Vickey Huger (Sleep)  CLINICAL INFORMATION/HISTORY: 76 year old female with an underlying medical history of paroxysmal A-fib, atrial flutter, stroke, reflux disease, hyperlipidemia, PSVT, and overweight state, who was previously diagnosed with obstructive sleep apnea.  She has had trouble tolerating her AutoPap of 5 to 15 cm.  Epworth sleepiness score: 1/24.  BMI: 28.3 kg/m  FINDINGS:   Sleep Summary:   Total Recording Time (hours, min): 7 hours, 5 min  Total Sleep Time (hours, min):  6 hours, 35 min  Percent REM (%):    25.9%   Respiratory Indices:   Calculated pAHI (per hour):  13/hour         REM pAHI:    27.8/hour       NREM pAHI: 6.6/hour  Central pAHI: 0/hour  Oxygen Saturation Statistics:    Oxygen Saturation (%) Mean: 94%   Minimum oxygen saturation (%):                 85%   O2 Saturation Range (%): 85-98%    O2 Saturation (minutes) <=88%: 0.1 min  Pulse Rate Statistics:   Pulse Mean (bpm):    57/min    Pulse Range (34-83/min)   IMPRESSION: OSA (obstructive sleep apnea)  RECOMMENDATION:  This home sleep test demonstrates overall mild obstructive sleep apnea with a total AHI of ***/hour and O2 nadir of ***%. Snoring was detected, ***. Given the patient's medical history and sleep related complaints, therapy with a  positive airway pressure device is a reasonable first-line choice and clinically recommended. Treatment can be achieved in the form of autoPAP trial/titration at home for now. A full night, in-lab PAP titration study may aid in improving proper treatment settings and with mask fit, if needed, down the road. Alternative treatments may include weight loss (where  appropriate) along with avoidance of the supine sleep position (if possible), or an oral appliance in appropriate candidates.   Please note that untreated obstructive sleep apnea may carry additional perioperative morbidity. Patients with significant obstructive sleep apnea should receive perioperative PAP therapy and the surgeons and particularly the anesthesiologist should be informed of the diagnosis and the severity of the sleep disordered breathing. The patient should be cautioned not to drive, work at heights, or operate dangerous or heavy equipment when tired or sleepy. Review and reiteration of good sleep hygiene measures should be pursued with any patient. Other causes of the patient's symptoms, including circadian rhythm disturbances, an underlying mood disorder, medication effect and/or an underlying medical problem cannot be ruled out based on this test. Clinical correlation is recommended.  The patient and *** referring provider will be notified of the test results. The patient will be seen in follow up in sleep clinic at [facility], as necessary.  I certify that I have reviewed the raw data recording prior to the issuance of this report in accordance with the standards of the American Academy of Sleep Medicine (AASM).    INTERPRETING PHYSICIAN:   Huston Foley, MD, PhD Medical Director, [facility]) Diplomat, ABPN (Neurology and Sleep)   [facility] [address], [address] [place], [place] [address] [phone]

## 2022-12-19 ENCOUNTER — Telehealth: Payer: Self-pay | Admitting: Neurology

## 2022-12-19 NOTE — Procedures (Signed)
[facility]  HOME SLEEP TEST (Watch PAT) REPORT   STUDY DATE: 12/11/2022  DOB: [birth year]  MRN: [id number]  ORDERING CLINICIAN: Star Age, MD, PhD - study interpreted on behalf of Dr. Brett Fairy   REFERRING CLINICIAN: Pomposini, Cherly Anderson, MD (PCP), Dr. Brett Fairy (Sleep)  CLINICAL INFORMATION/HISTORY: 77 year old female with an underlying medical history of paroxysmal A-fib, atrial flutter, stroke, reflux disease, hyperlipidemia, PSVT, and overweight state, who was previously diagnosed with obstructive sleep apnea.  She has had trouble tolerating her AutoPap of 5 to 15 cm.  Epworth sleepiness score: 1/24.  BMI: 28.3 kg/m  FINDINGS:   Sleep Summary:   Total Recording Time (hours, min): 7 hours, 5 min  Total Sleep Time (hours, min):  6 hours, 35 min  Percent REM (%):    25.9%   Respiratory Indices:   Calculated pAHI (per hour):  13/hour         REM pAHI:    27.8/hour       NREM pAHI: 6.6/hour  Central pAHI: 0/hour  Oxygen Saturation Statistics:    Oxygen Saturation (%) Mean: 94%   Minimum oxygen saturation (%):                 85%   O2 Saturation Range (%): 85-98%    O2 Saturation (minutes) <=88%: 0.1 min  Pulse Rate Statistics:   Pulse Mean (bpm):    57/min    Pulse Range (34-83/min)   IMPRESSION: OSA (obstructive sleep apnea)  RECOMMENDATION:  This home sleep test demonstrates overall mild obstructive sleep apnea with a total AHI of 13/hour and O2 nadir of 85%. Snoring was detected, and appeared to be intermittent, in the mild to moderate range. Given the patient's medical history and sleep related complaints, ongoing therapy with a positive airway pressure device is a reasonable first-line choice and clinically recommended. Treatment can be achieved in the form of autoPAP trial/titration at home for now.  If the patient has tolerance issues with AutoPap therapy, consideration should be given to a full night, in-lab PAP titration study,  which may aid in improving proper treatment settings and with mask fit. Alternative treatments may include weight loss (where appropriate) along with avoidance of the supine sleep position (if possible), or an oral appliance in appropriate candidates.   Please note that untreated obstructive sleep apnea may carry additional perioperative morbidity. Patients with significant obstructive sleep apnea should receive perioperative PAP therapy and the surgeons and particularly the anesthesiologist should be informed of the diagnosis and the severity of the sleep disordered breathing. The patient should be cautioned not to drive, work at heights, or operate dangerous or heavy equipment when tired or sleepy. Review and reiteration of good sleep hygiene measures should be pursued with any patient. Other causes of the patient's symptoms, including circadian rhythm disturbances, an underlying mood disorder, medication effect and/or an underlying medical problem cannot be ruled out based on this test. Clinical correlation is recommended.  The patient and her referring provider will be notified of the test results. The patient will be seen in follow up in sleep clinic at [facility], as necessary.  I certify that I have reviewed the raw data recording prior to the issuance of this report in accordance with the standards of the American Academy of Sleep Medicine (AASM).  INTERPRETING PHYSICIAN:   Star Age, MD, PhD Medical Director, [facility]) [place], ABPN (Neurology and Sleep)   [facility] [address], [place], [place]  [address] [phone]                  

## 2022-12-19 NOTE — Telephone Encounter (Signed)
This patient saw Dr. Brett Fairy for sleep evaluation on 09/20/2022.  I read the home sleep test from 12/11/2022 on Dr. Edwena Felty behalf:  The patient has mild sleep apnea, she has an AutoPap machine and I recommend she continue with treatment given her cardiac history.  If she has trouble tolerating her AutoPap, we can consider bringing her in for a CPAP titration study which would be an in lab overnight study if she would be agreeable, please let me know.  We can also request a mask refit through her current DME company.  Please let me know how she would like to proceed.  She can also continue with the current settings and the current mask and make a follow-up appointment to discuss with Dr. Brett Fairy further if she prefers.

## 2022-12-20 ENCOUNTER — Encounter: Payer: Self-pay | Admitting: Neurology

## 2022-12-20 DIAGNOSIS — G4730 Sleep apnea, unspecified: Secondary | ICD-10-CM

## 2022-12-20 NOTE — Telephone Encounter (Signed)
Sent mychart to pt.

## 2022-12-26 NOTE — Telephone Encounter (Signed)
Please send an order for new AutoPap machine and sent to her DME provider with new mask/mask fit.  For now, can maintain settings as her old AutoPap machine.

## 2022-12-26 NOTE — Telephone Encounter (Signed)
Meghan Age, MD     12/19/22  7:50 PM Note This patient saw Dr. Brett Fairy for sleep evaluation on 09/20/2022.  I read the home sleep test from 12/11/2022 on Dr. Edwena Felty behalf:  The patient has mild sleep apnea, she has an AutoPap machine and I recommend she continue with treatment given her cardiac history.  If she has trouble tolerating her AutoPap, we can consider bringing her in for a CPAP titration study which would be an in lab overnight study if she would be agreeable, please let me know.  We can also request a mask refit through her current DME company.  Please let me know how she would like to proceed.  She can also continue with the current settings and the current mask and make a follow-up appointment to discuss with Dr. Brett Fairy further if she prefers.

## 2022-12-27 NOTE — Telephone Encounter (Signed)
Fax confirmation received for order for new machine to [facility] for autopap machine.

## 2022-12-27 NOTE — Addendum Note (Signed)
Addended by: Brandon Melnick on: 12/27/2022 08:17 AM   Modules accepted: Orders

## 2023-01-03 ENCOUNTER — Encounter: Payer: Self-pay | Admitting: Neurology

## 2023-01-07 ENCOUNTER — Other Ambulatory Visit: Payer: Self-pay | Admitting: Neurology

## 2023-01-07 DIAGNOSIS — I480 Paroxysmal atrial fibrillation: Secondary | ICD-10-CM

## 2023-01-07 DIAGNOSIS — K219 Gastro-esophageal reflux disease without esophagitis: Secondary | ICD-10-CM

## 2023-01-07 DIAGNOSIS — G4730 Sleep apnea, unspecified: Secondary | ICD-10-CM

## 2023-04-04 ENCOUNTER — Ambulatory Visit: Payer: Medicare Other | Admitting: Neurology

## 2023-04-09 ENCOUNTER — Ambulatory Visit: Payer: Medicare Other | Admitting: Neurology

## 2023-04-09 ENCOUNTER — Encounter: Payer: Self-pay | Admitting: Neurology

## 2023-04-09 VITALS — BP 153/64 | HR 54 | Ht 62.0 in | Wt 154.0 lb

## 2023-04-09 DIAGNOSIS — I480 Paroxysmal atrial fibrillation: Secondary | ICD-10-CM

## 2023-04-09 DIAGNOSIS — G4730 Sleep apnea, unspecified: Secondary | ICD-10-CM | POA: Diagnosis not present

## 2023-04-09 DIAGNOSIS — Z8673 Personal history of transient ischemic attack (TIA), and cerebral infarction without residual deficits: Secondary | ICD-10-CM | POA: Diagnosis not present

## 2023-04-09 NOTE — Patient Instructions (Signed)

## 2023-04-09 NOTE — Progress Notes (Signed)
Provider:  Melvyn Novas, MD    SLEEP MEDICINE CLINIC   Primary Care Physician:  Pomposini, Rande Brunt, MD No address on file     Referring Provider: Naomie Dean, MD         Chief Complaint according to patient   Patient presents with:     New Patient (Initial Visit)           HISTORY OF PRESENT ILLNESS:  Meghan Keller is a 77 y.o. female patient who is here for revisit 04/09/2023 .  Chief concern according to patient :  "I like my new CPAP much better, and the humidity works. "       HISTORY OF PRESENT ILLNESS:  Meghan Keller is a 77 y.o. year old White or Caucasian female patient seen here: This is my revisit with Mrs. Meghan Keller, a 77 year old Caucasian female patient of Meghan. Duwayne Heck L pulm Keller who had been in my absence under Meghan. Teofilo Keller care. On December 26 the patient underwent a home sleep test; She provided 6 hours and 35 minutes of sleep with 26% REM sleep at an AHI of 13/h. MILD APNEA. There was no prolonged oxygen desaturation, the nadir of oxygen was 85%.  This was a strictly REM sleep dependent apnea as her REM AHI was 28 and her non-REM AHI about 7/h.  No central apneas were noticed.  Also the patient has a history of paroxysmal atrial fibrillation this condition cannot be identified by home sleep test.  My esteemed colleague wrote for an Auto- titration CPAP for use at home. Today's visit is dedicated to the compliance with the new CPAP machine which is an AirSense 11 AutoSet.  She has an average use of time of 6 hours 21 minutes each night and has been 100% compliant for days and 97% compliant 4 hours.  Setting is between 5 and 15 cm water was 2 cm EPR and a residual AHI is 0.7/h.  The 95th percentile pressure is close to 10 cm the 95th percentile air leak is 29 L/min.  The patient voiced satisfaction with the new CPAP machine.  Fatigue severity scale is now endorsed at 12 point and the Epworth sleepiness score was endorsed at  1 point which was the same as prior to her new titration.  She does not endorse depression on the geriatric depression scale.           Upon referral on 09/20/2022 from Meghan Keller for a sleep consultation.  The patient presents here on 20 September 2022 as a new patient to the sleep clinic.  She reports that 5-1/2 years ago her primary care physician had ordered a home sleep test for her and based on those results she was set up with an O2 titration CPAP.Marland Kitchen  The her medical equipment company is in [facility] home health.  She was set up on 06-27-2017.    She has not always been able to comply with the CPAP regimen- but looking at her last 90 days she has made a valid attempt she has used the machine 89 out of 90 days but only 35 days was she able to use it over 4 hours.  So the average user time is between 3 and 4 hours at night the machine is an AutoSet 10 ResMed machine with a minimum pressure of 5 and maximum pressure of 15 and an expiratory relief set at 2 cm of water  pressure.  She does have moderate air leaks at the 95th percentile 30.4 L/min she does have a 95th percentile pressure of 12.9 cm water so roughly this is 13 cm water and she is struggling against the upper setting of her CPAP.  There are no significant central apneas arising.  The residual AHI in October 2023 is 2.1/h so the reduction in apnea seems to be sufficient.  It is a problem to tolerate the CPAP.   Patient endorsed at 2 points out of 15 on a geriatric depression scale which is not clinically significant, her Epworth sleep sleepiness score was endorsed at only 1.2 today for her falling asleep when watching television.  Her fatigue severity scale is endorsed at 26 out of 63 points which is also not significant.     She suffered atrial fibrillation, embolic strokes in 01-2020, from there on she followed with Meghan Keller.      " can't use CPAP all  night  but my cardiologists wants me to, Meghan Keller also urged  me after my stroke- I am left with residual let numbness."      Review of Systems: Out of a complete 14 system review, the patient complains of only the following symptoms, and all other reviewed systems are negative.:  Atrial fib , symptomatic when its happens.    How likely are you to doze in the following situations: 0 = not likely, 1 = slight chance, 2 = moderate chance, 3 = high chance   Sitting and Reading? Watching Television? Sitting inactive in a public place (theater or meeting)? As a passenger in a car for an hour without a break? Lying down in the afternoon when circumstances permit? Sitting and talking to someone? Sitting quietly after lunch without alcohol? In a car, while stopped for a few minutes in traffic?   Fatigue severity scale is now endorsed at 12 point and the Epworth sleepiness score was endorsed at 1 point which was the same as prior to her new titration.  She does not endorse depression on the geriatric depression scale.    Social History   Socioeconomic History   Marital status: Married    Spouse name: Not on file   Number of children: 2   Years of education: 12+   Highest education level: Not on file  Occupational History   Not on file  Tobacco Use   Smoking status: Never   Smokeless tobacco: Never  Vaping Use   Vaping Use: Never used  Substance and Sexual Activity   Alcohol use: Never   Drug use: Never   Sexual activity: Not on file  Other Topics Concern   Not on file  Social History Narrative   Lives at home with husband   Right handed   Caffeine: 2 cups/day    Social Determinants of Health   Financial Resource Strain: Not on file  Food Insecurity: Not on file  Transportation Needs: Not on file  Physical Activity: Not on file  Stress: Not on file  Social Connections: Not on file    Family History  Problem Relation Age of Onset   Bladder Cancer Mother    Leukemia Mother    Hypertension Mother    Other Mother        smoked    Esophageal cancer Father    Hypertension Father    Atrial fibrillation Father    Other Father        smoked   High Cholesterol Father  Glaucoma Father    Hypertension Brother    Heart attack Brother    Hypertension Brother    Atrial fibrillation Brother        heart ablation   High Cholesterol Brother    Stroke Paternal Grandfather        pt believes he had one in his 51s     Past Medical History:  Diagnosis Date   Adenomatous polyp    ascending colon   Aortic valve regurgitation    "mild"   Arthritis    Atrial flutter (HCC)    Benign essential hypertension    Blood in urine 2017   Carotid bruit    CVA (cerebral vascular accident) (HCC) 01/2020   Diverticular disease    Fibrocystic disease of breast    GERD with esophagitis    Hyperglycemia    Hyperlipidemia    Lipid storage disease    Obesity    Osteopenia    PSVT (paroxysmal supraventricular tachycardia)    Sleep apnea    "mild", was on CPAP before stroke, pt states she needs to get back on it    Wide-angle glaucoma     Past Surgical History:  Procedure Laterality Date   BREAST BIOPSY Left 12/2007   Meghan Morey Hummingbird- fibrocystic Fibroadenoma. pt reports this was benign both times   COLONOSCOPY N/A 10/20/2020   Procedure: COLONOSCOPY;  Surgeon: Malissa Hippo, MD;  [facility]: [facility];  Service: Endoscopy;  Laterality: N/A;  930   rotator cuff surgery Right 2006     Current Outpatient Medications on File Prior to Visit  Medication Sig Dispense Refill   Calcium Carb-Cholecalciferol (CALCIUM 600+D) 600-800 MG-UNIT TABS Take 1 tablet by mouth daily.      Cyanocobalamin (VITAMIN B12) 500 MCG TABS Take 500 mcg by mouth daily.      ELIQUIS 5 MG TABS tablet Take 5 mg by mouth 2 (two) times daily.     famotidine (PEPCID) 20 MG tablet Take 20 mg by mouth at bedtime.     Flaxseed, Linseed, (FLAXSEED OIL) 1000 MG CAPS Take 1,000 mg by mouth daily.      flecainide (TAMBOCOR) 50 MG tablet Take 50 mg by mouth in the  morning and at bedtime.     folic acid (FOLVITE) 800 MCG tablet Take 800 mcg by mouth daily.      latanoprost (XALATAN) 0.005 % ophthalmic solution Place 1 drop into both eyes at bedtime.     metoprolol succinate (TOPROL-XL) 25 MG 24 hr tablet Take 25 mg by mouth daily.     metoprolol tartrate (LOPRESSOR) 25 MG tablet Take 25 mg by mouth as needed (for HR >100).     niacin (NIASPAN) 1000 MG CR tablet Take 1,000 mg by mouth in the morning and at bedtime.     Omega-3 Fatty Acids (OMEGA-3 FISH OIL) 1200 MG CAPS Take 1,200 mg by mouth daily.     pantoprazole (PROTONIX) 40 MG tablet Take 40 mg by mouth daily.     pyridOXINE (VITAMIN B-6) 50 MG tablet Take 50 mg by mouth daily.     rosuvastatin (CRESTOR) 10 MG tablet Take 20 mg by mouth at bedtime.     loratadine (CLARITIN) 10 MG tablet Take 10 mg by mouth daily. (Patient not taking: Reported on 04/09/2023)     No current facility-administered medications on file prior to visit.    Allergies  Allergen Reactions   Anaprox [Naproxen] Other (See Comments)    Stomach upset   Ansaid [  Flurbiprofen] Other (See Comments)    Stomach upset   Aspirin     "I can tolerate 81 mg"   Lortab [Hydrocodone-Acetaminophen] Other (See Comments)    Upset Stomach   Simvastatin Other (See Comments)    Leg pain      DIAGNOSTIC DATA (LABS, IMAGING, TESTING) - I reviewed patient records, labs, notes, testing and imaging myself where available.  No results found for: "WBC", "HGB", "HCT", "MCV", "PLT" No results found for: "NA", "K", "CL", "CO2", "GLUCOSE", "BUN", "CREATININE", "CALCIUM", "PROT", "ALBUMIN", "AST", "ALT", "ALKPHOS", "BILITOT", "GFRNONAA", "GFRAA" No results found for: "CHOL", "HDL", "LDLCALC", "LDLDIRECT", "TRIG", "CHOLHDL" No results found for: "HGBA1C" No results found for: "VITAMINB12" No results found for: "TSH"  PHYSICAL EXAM:  Today's Vitals   04/09/23 1308  BP: (!) 153/64  Pulse: (!) 54  Weight: 154 lb (69.9 kg)  Height:  (1.575  m)   Body mass index is 28.17 kg/m.   Wt Readings from Last 3 Encounters:  04/09/23 154 lb (69.9 kg)  09/20/22 160 lb (72.6 kg)  10/20/20 159 lb (72.1 kg)     Ht Readings from Last 3 Encounters:  04/09/23  (1.575 m)  09/20/22  (1.6 m)  10/20/20  (1.575 m)      General: The patient is awake, alert and appears not in acute distress. The patient is well groomed. Head: Normocephalic, atraumatic.  Neck is supple. Mallampati 1,  neck circumference:13. 5 inches . Nasal airflow  patent.  Retrognathia is not seen.  Dental status:  biological  Cardiovascular: regular rate by radial pulse-  without distended neck veins. Respiratory: Lungs are clear to auscultation.  Skin:  With evidence of ankle edema, or rash. Trunk: The patient's posture is erect.   Neurologic exam : The patient is awake and alert, oriented to place and time.   Memory subjective described as intact.  Attention span & concentration ability appears normal.  Speech is fluent,  without  dysarthria, dysphonia or aphasia.  Mood and affect are appropriate.   Cranial nerves: no loss of smell or taste reported  Pupils are equal and briskly reactive to light. Funduscopic exam deferred. .  Extraocular movements in vertical and horizontal planes were intact and without nystagmus. No Diplopia. Visual fields by finger perimetry are intact. Hearing was intact to soft voice and finger rubbing.    Facial sensation intact to fine touch.  Facial motor strength is symmetric and tongue and uvula move midline.  Neck ROM : rotation, tilt and flexion extension were normal for age and shoulder shrug was symmetrical.    Motor exam:  Symmetric bulk, tone and ROM.   Normal tone without cog -wheeling, left grip is weaker. .   Sensory:  Fine touch, pinprick and vibration were normal.  Proprioception tested in the upper extremities was normal.   Coordination: Rapid alternating movements in the fingers/hands were of normal  speed.  The Finger-to-nose maneuver was intact without evidence of ataxia, dysmetria or tremor.   Gait and station: Patient could rise unassisted from a seated position, walked without assistive device.  Stance is of normal width/ base and the patient turned with 3.5 steps.  Toe and heel walk were deferred.  Deep tendon reflexes: in the left upper and lower extremities are more brisk, slight spasticity.   ASSESSMENT AND PLAN 77 y.o. year old female  here with:    1) new CPAP autotitration machine has been met with high compliance. 100% for days and 97% for hours,  excellent resolution of apnea and the patient has not had any atrial fibrillation events since early January 2024.   2) she can finally use this CPAP. She uses nasal pillows.   3) RV yearly for sleep apnea, STROKE follow up is no longer needed.      I plan to follow up either personally or through our NP within 12 months.   I would like to thank Pomposini, Rande Brunt, MD and Pomposini, Rande Brunt, Md No address on file for allowing me to meet with and to take care of this pleasant patient.   After spending a total time of  27  minutes face to face and additional time for physical and neurologic examination, review of laboratory studies,  personal review of imaging studies, reports and results of other testing and review of referral information / records as far as provided in visit,   Electronically signed by: Meghan Novas, MD 04/09/2023 1:59 PM  [facility] Board certified by The [employer] of Sleep Medicine and Diplomate of the [employer] of Sleep Medicine. Board certified In Neurology through the ABPN, Fellow of the [employer] of Neurology. Medical Director of [facility].

## 2023-07-30 ENCOUNTER — Ambulatory Visit: Payer: Medicare Other | Admitting: Internal Medicine

## 2023-07-30 ENCOUNTER — Telehealth: Payer: Self-pay | Admitting: *Deleted

## 2023-07-30 ENCOUNTER — Encounter: Payer: Self-pay | Admitting: Internal Medicine

## 2023-07-30 VITALS — BP 126/69 | HR 59 | Temp 98.5°F | Ht 62.0 in | Wt 157.6 lb

## 2023-07-30 DIAGNOSIS — K219 Gastro-esophageal reflux disease without esophagitis: Secondary | ICD-10-CM

## 2023-07-30 NOTE — Patient Instructions (Addendum)
It was nice to meet you today!  As discussed, Nexium is best taken 30 minutes before breakfast.  You may take a dose of Pepcid later in the day if you have any break through symptoms of reflux  GERD Information provided  As discussed, I have recommended you have an EGD to look at your esophagus and stomach.  We will need to get permission from your cardiologist to hold the Eliquis for 2 days  Further recommendations to follow.

## 2023-07-30 NOTE — Telephone Encounter (Signed)
Faxed to Dr. Edd Arbour in [place] [phone]

## 2023-07-30 NOTE — Progress Notes (Signed)
Primary Care Physician:  Pomposini, Rande Brunt, MD Primary Gastroenterologist:  Dr. Jena Gauss  Pre-Procedure History & Physical: HPI:  Meghan Keller is a 77 y.o. female in [place] who came to see me for further evaluation and management of GERD.  Cites reflux symptoms going back 30 to 40 years per Dr. Aleene Davidson performed the EGD 30 years ago.  Findings not known.  Patient has been on various acid suppressing agents over the years.  Previously on Protonix switched to Nexium 40 mg daily by ENT.  She denies dysphagia.  She takes her Nexium at bedtime.  Takes Pepcid 20 mg in the morning.  Has breakthrough symptoms at least 3 times a week.  Has not had any melena or rectal bleeding last colonoscopy 2021.  Surveillance for polyps.  Last 2 colonoscopies negative for polyps.  Brother with polyps. He is aged out of further surveillance.  She tells me Dr. Jonelle Sports is retiring and she will begin seeing Dr. Dwana Melena as her PCP.  On Eliquis for atrial flutter/atrial fibrillation -hx of embolic stroke with good neurologic reovery.  Past Medical History:  Diagnosis Date   Adenomatous polyp    ascending colon   Aortic valve regurgitation    "mild"   Arthritis    Atrial flutter (HCC)    Benign essential hypertension    Blood in urine 2017   Carotid bruit    CVA (cerebral vascular accident) (HCC) 01/2020   Diverticular disease    Fibrocystic disease of breast    GERD with esophagitis    Hyperglycemia    Hyperlipidemia    Lipid storage disease    Obesity    Osteopenia    PSVT (paroxysmal supraventricular tachycardia)    Sleep apnea    "mild", was on CPAP before stroke, pt states she needs to get back on it    Wide-angle glaucoma     Past Surgical History:  Procedure Laterality Date   BREAST BIOPSY Left 12/2007   Dr Morey Hummingbird- fibrocystic Fibroadenoma. pt reports this was benign both times   COLONOSCOPY N/A 10/20/2020   Procedure: COLONOSCOPY;  Surgeon: Malissa Hippo, MD;   [facility];  Service: Endoscopy;  Laterality: N/A;  930   rotator cuff surgery Right 2006    Prior to Admission medications   Medication Sig Start Date End Date Taking? Authorizing Provider  Calcium Carb-Cholecalciferol (CALCIUM 600+D) 600-800 MG-UNIT TABS Take 1 tablet by mouth daily.    Yes [provider]  Cyanocobalamin (VITAMIN B12) 500 MCG TABS Take 500 mcg by mouth daily.    Yes [provider]  ELIQUIS 5 MG TABS tablet Take 5 mg by mouth 2 (two) times daily. 02/27/20  Yes [provider]  esomeprazole (NEXIUM) 40 MG capsule Take 40 mg by mouth at bedtime.   Yes [provider]  estradiol (ESTRACE) 0.1 MG/GM vaginal cream SMARTSIG:Gram(s) Vaginal 2-3 Times Weekly   Yes [provider]  famotidine (PEPCID) 20 MG tablet Take 20 mg by mouth daily before breakfast.   Yes [provider]  Flaxseed, Linseed, (FLAXSEED OIL) 1000 MG CAPS Take 1,000 mg by mouth daily.    Yes [provider]  flecainide (TAMBOCOR) 50 MG tablet Take 50 mg by mouth in the morning and at bedtime. 07/30/22  Yes [provider]  folic acid (FOLVITE) 800 MCG tablet Take 800 mcg by mouth daily.    Yes [provider]  latanoprost (XALATAN) 0.005 % ophthalmic solution Place 1 drop  into both eyes at bedtime.   Yes [provider]  metoprolol succinate (TOPROL-XL) 25 MG 24 hr tablet Take 25 mg by mouth daily. 12/27/19  Yes [provider]  metoprolol tartrate (LOPRESSOR) 25 MG tablet Take 25 mg by mouth as needed (for HR >100).   Yes [provider]  niacin (NIASPAN) 1000 MG CR tablet Take 1,000 mg by mouth in the morning and at bedtime. 02/08/20  Yes [provider]  Omega-3 Fatty Acids (OMEGA-3 FISH OIL) 1200 MG CAPS Take 1,200 mg by mouth daily.   Yes [provider]  pyridOXINE (VITAMIN B-6) 50 MG tablet Take 50 mg by mouth daily.   Yes [provider]  rosuvastatin (CRESTOR) 10  MG tablet Take 20 mg by mouth at bedtime. 02/24/20  Yes [provider]    Allergies as of 07/30/2023 - Review Complete 07/30/2023  Allergen Reaction Noted   Anaprox [naproxen] Other (See Comments) 03/16/2020   Ansaid [flurbiprofen] Other (See Comments) 03/16/2020   Aspirin  03/16/2020   Lortab [hydrocodone-acetaminophen] Other (See Comments) 03/16/2020   Simvastatin Other (See Comments) 10/20/2020    Family History  Problem Relation Age of Onset   Bladder Cancer Mother    Leukemia Mother    Hypertension Mother    Other Mother        smoked   Esophageal cancer Father    Hypertension Father    Atrial fibrillation Father    Other Father        smoked   High Cholesterol Father    Glaucoma Father    Hypertension Brother    Heart attack Brother    Hypertension Brother    Atrial fibrillation Brother        heart ablation   High Cholesterol Brother    Stroke Paternal Grandfather        pt believes he had one in his 36s     Social History   Socioeconomic History   Marital status: Married    Spouse name: Not on file   Number of children: 2   Years of education: 12+   Highest education level: Not on file  Occupational History   Not on file  Tobacco Use   Smoking status: Never   Smokeless tobacco: Never  Vaping Use   Vaping status: Never Used  Substance and Sexual Activity   Alcohol use: Never   Drug use: Never   Sexual activity: Not on file  Other Topics Concern   Not on file  Social History Narrative   Lives at home with husband   Right handed   Caffeine: 2 cups/day    Social Determinants of Health   Financial Resource Strain: Not on file  Food Insecurity: Not on file  Transportation Needs: Not on file  Physical Activity: Not on file  Stress: Not on file  Social Connections: Not on file  Intimate Partner Violence: Not on file    Review of Systems: See HPI, otherwise negative ROS  Physical Exam: BP 126/69 (BP Location: Right Arm, Patient  Position: Sitting, Cuff Size: Normal)   Pulse (!) 59   Temp 98.5 F (36.9 C) (Oral)   Ht 5\' 2"  (1.575 m)   Wt 157 lb 9.6 oz (71.5 kg)   SpO2 98%   BMI 28.83 kg/m  General:   Alert,  Well-developed, well-nourished, pleasant and cooperative in NAD Heart:  Regular rate and rhythm; no murmurs, clicks, rubs,  or gallops. Abdomen: Non-distended, normal bowel sounds.  Soft and  nontender without appreciable mass or hepatosplenomegaly.   Impression/Plan: Very pleasant 77 year old lady with lifelong GERD.  Poorly controlled on PPI therapy recently.  Otherwise, no alarm symptoms.  She is taking her PPI incorrectly as discussed with her today.  She ought to have reassessment of her upper GI tract via EGD as discussed.  Recommendations:   Nexium is best taken 30 minutes before breakfast.  Take a dose of Pepcid later in the day if you have any break through symptoms of reflux  GERD Information provided  As discussed, I have recommended you have an EGD to look at your esophagus and stomach.  The risks, benefits, limitations, alternatives and imponderables have been reviewed with the patient. Potential for esophageal dilation, biopsy, etc. have also been reviewed.  Questions have been answered. All parties agreeable.   We will need to get permission from your cardiologist to hold the Eliquis for 2 days  Further recommendations to follow.       Notice: This dictation was prepared with Dragon dictation along with smaller phrase technology. Any transcriptional errors that result from this process are unintentional and may not be corrected upon review.

## 2023-07-30 NOTE — Telephone Encounter (Signed)
  Request for patient to stop medication prior to procedure or is needing cleareance  07/30/23  TRINITI DENI October 15, 1946  What type of surgery is being performed? EGD  When is surgery scheduled? TBD  Clearance to hold Eliquis 2 days prior  Name of physician performing surgery?  Dr. Jaci Lazier [facility] Phone: [phone] Fax: [phone]  Anethesia type (none, local, MAC, general)? MAC

## 2023-08-05 NOTE — Telephone Encounter (Signed)
Received clearance for patient to hold eliquis x 2 days for procedure. FYI  Will call once we have future schedule to schedule for procedure

## 2023-08-30 NOTE — Telephone Encounter (Signed)
Called pt. Scheduled for 10/11. Aware to hold eliquis 2 days prior. Will call back with pre-op appt.

## 2023-09-02 ENCOUNTER — Encounter: Payer: Self-pay | Admitting: *Deleted

## 2023-09-24 NOTE — Patient Instructions (Signed)
Meghan Keller  09/24/2023     @PREFPERIOPPHARMACY @   Your procedure is scheduled on  09/27/2023.   Report to [facility] at  0600  A.M.   Call this number if you have problems the morning of surgery:  [phone]  If you experience any cold or flu symptoms such as cough, fever, chills, shortness of breath, etc. between now and your scheduled surgery, please notify us at the above number.   Remember:  Do not eat after midnight.    You may drink clear liquids until 0330 am on 09/27/2023.   Clear liquids allowed are:                    Water, Carbonated beverages (diabetics please choose diet or no sugar options), Black Coffee Only (No creamer, milk or cream, including half & half and powdered creamer), Clear Sports drink (No red color; diabetics please choose diet or no sugar options), and Plain Popsicles Only (No red color; diabetics please choose no sugar options)    Take these medicines the morning of surgery with A SIP OF WATER                          nexium, pepcid, flecanide, metoprolol.     Do not wear jewelry, make-up or nail polish, including gel polish,  artificial nails, or any other type of covering on natural nails (fingers and  toes).  Do not wear lotions, powders, or perfumes, or deodorant.  Do not shave 48 hours prior to surgery.  Men may shave face and neck.  Do not bring valuables to the hospital.  [facility] is not responsible for any belongings or valuables.  Contacts, dentures or bridgework may not be worn into surgery.  Leave your suitcase in the car.  After surgery it may be brought to your room.  For patients admitted to the hospital, discharge time will be determined by your treatment team.  Patients discharged the day of surgery will not be allowed to drive home and must have someone with them for 24 hours.    Special instructions:   DO NOT smoke tobacco or vape for 24 hours before your procedure.  Please read over the following  fact sheets that you were given. Anesthesia Post-op Instructions and Care and Recovery After Surgery      Upper Endoscopy, Adult, Care After After the procedure, it is common to have a sore throat. It is also common to have: Mild stomach pain or discomfort. Bloating. Nausea. Follow these instructions at home: The instructions below may help you care for yourself at home. Your health care provider may give you more instructions. If you have questions, ask your health care provider. If you were given a sedative during the procedure, it can affect you for several hours. Do not drive or operate machinery until your health care provider says that it is safe. If you will be going home right after the procedure, plan to have a responsible adult: Take you home from the hospital or clinic. You will not be allowed to drive. Care for you for the time you are told. Follow instructions from your health care provider about what you may eat and drink. Return to your normal activities as told by your health care provider. Ask your health care provider what activities are safe for you. Take over-the-counter and prescription medicines only as told by your health care provider.  Contact a health care provider if you: Have a sore throat that lasts longer than one day. Have trouble swallowing. Have a fever. Get help right away if you: Vomit blood or your vomit looks like coffee grounds. Have bloody, black, or tarry stools. Have a very bad sore throat or you cannot swallow. Have difficulty breathing or very bad pain in your chest or abdomen. These symptoms may be an emergency. Get help right away. Call 911. Do not wait to see if the symptoms will go away. Do not drive yourself to the hospital. Summary After the procedure, it is common to have a sore throat, mild stomach discomfort, bloating, and nausea. If you were given a sedative during the procedure, it can affect you for several hours. Do not drive  until your health care provider says that it is safe. Follow instructions from your health care provider about what you may eat and drink. Return to your normal activities as told by your health care provider. This information is not intended to replace advice given to you by your health care provider. Make sure you discuss any questions you have with your health care provider. Document Revised: 03/14/2022 Document Reviewed: 03/14/2022 Elsevier Patient Education  2024 [employer] Monitored Anesthesia Care, Care After The following information offers guidance on how to care for yourself after your procedure. Your health care provider may also give you more specific instructions. If you have problems or questions, contact your health care provider. What can I expect after the procedure? After the procedure, it is common to have: Tiredness. Little or no memory about what happened during or after the procedure. Impaired judgment when it comes to making decisions. Nausea or vomiting. Some trouble with balance. Follow these instructions at home: For the time period you were told by your health care provider:  Rest. Do not participate in activities where you could fall or become injured. Do not drive or use machinery. Do not drink alcohol. Do not take sleeping pills or medicines that cause drowsiness. Do not make important decisions or sign legal documents. Do not take care of children on your own. Medicines Take over-the-counter and prescription medicines only as told by your health care provider. If you were prescribed antibiotics, take them as told by your health care provider. Do not stop using the antibiotic even if you start to feel better. Eating and drinking Follow instructions from your health care provider about what you may eat and drink. Drink enough fluid to keep your urine pale yellow. If you vomit: Drink clear fluids slowly and in small amounts as you are able. Clear fluids  include water, ice chips, low-calorie sports drinks, and fruit juice that has water added to it (diluted fruit juice). Eat light and bland foods in small amounts as you are able. These foods include bananas, applesauce, rice, lean meats, toast, and crackers. General instructions  Have a responsible adult stay with you for the time you are told. It is important to have someone help care for you until you are awake and alert. If you have sleep apnea, surgery and some medicines can increase your risk for breathing problems. Follow instructions from your health care provider about wearing your sleep device: When you are sleeping. This includes during daytime naps. While taking prescription pain medicines, sleeping medicines, or medicines that make you drowsy. Do not use any products that contain nicotine or tobacco. These products include cigarettes, chewing tobacco, and vaping devices, such as e-cigarettes. If you need help  quitting, ask your health care provider. Contact a health care provider if: You feel nauseous or vomit every time you eat or drink. You feel light-headed. You are still sleepy or having trouble with balance after 24 hours. You get a rash. You have a fever. You have redness or swelling around the IV site. Get help right away if: You have trouble breathing. You have new confusion after you get home. These symptoms may be an emergency. Get help right away. Call 911. Do not wait to see if the symptoms will go away. Do not drive yourself to the hospital. This information is not intended to replace advice given to you by your health care provider. Make sure you discuss any questions you have with your health care provider. Document Revised: 04/30/2022 Document Reviewed: 04/30/2022 Elsevier Patient Education  2024 [employer].

## 2023-09-25 ENCOUNTER — Encounter (HOSPITAL_COMMUNITY): Payer: Self-pay

## 2023-09-25 ENCOUNTER — Encounter (HOSPITAL_COMMUNITY)
Admission: RE | Admit: 2023-09-25 | Discharge: 2023-09-25 | Disposition: A | Discharge status: Home / Self Care | Payer: Medicare Other | Source: Ambulatory Visit | Attending: Internal Medicine | Admitting: Internal Medicine

## 2023-09-25 VITALS — BP 135/48 | HR 59 | Temp 97.8°F | Resp 18 | Ht 62.0 in | Wt 157.6 lb

## 2023-09-25 DIAGNOSIS — Z01818 Encounter for other preprocedural examination: Secondary | ICD-10-CM | POA: Diagnosis present

## 2023-09-25 DIAGNOSIS — I480 Paroxysmal atrial fibrillation: Secondary | ICD-10-CM | POA: Insufficient documentation

## 2023-09-25 DIAGNOSIS — Z01810 Encounter for preprocedural cardiovascular examination: Secondary | ICD-10-CM | POA: Insufficient documentation

## 2023-09-25 HISTORY — DX: Cardiac arrhythmia, unspecified: I49.9

## 2023-09-25 HISTORY — DX: Unspecified atrial fibrillation: I48.91

## 2023-09-27 ENCOUNTER — Ambulatory Visit (HOSPITAL_COMMUNITY): Payer: Medicare Other | Admitting: Certified Registered"

## 2023-09-27 ENCOUNTER — Ambulatory Visit (HOSPITAL_COMMUNITY)
Admission: RE | Admit: 2023-09-27 | Discharge: 2023-09-27 | Disposition: A | Discharge status: Home / Self Care | Payer: Medicare Other | Attending: Internal Medicine | Admitting: Internal Medicine

## 2023-09-27 ENCOUNTER — Encounter (HOSPITAL_COMMUNITY): Payer: Self-pay | Admitting: Internal Medicine

## 2023-09-27 ENCOUNTER — Encounter (HOSPITAL_COMMUNITY)
Admission: RE | Disposition: A | Discharge status: Home / Self Care | Payer: Self-pay | Source: Home / Self Care | Attending: Internal Medicine

## 2023-09-27 DIAGNOSIS — I10 Essential (primary) hypertension: Secondary | ICD-10-CM | POA: Diagnosis not present

## 2023-09-27 DIAGNOSIS — K2100 Gastro-esophageal reflux disease with esophagitis, without bleeding: Secondary | ICD-10-CM | POA: Insufficient documentation

## 2023-09-27 DIAGNOSIS — G4730 Sleep apnea, unspecified: Secondary | ICD-10-CM | POA: Diagnosis not present

## 2023-09-27 DIAGNOSIS — K317 Polyp of stomach and duodenum: Secondary | ICD-10-CM | POA: Insufficient documentation

## 2023-09-27 DIAGNOSIS — I4891 Unspecified atrial fibrillation: Secondary | ICD-10-CM | POA: Insufficient documentation

## 2023-09-27 DIAGNOSIS — K219 Gastro-esophageal reflux disease without esophagitis: Secondary | ICD-10-CM

## 2023-09-27 DIAGNOSIS — Z7901 Long term (current) use of anticoagulants: Secondary | ICD-10-CM | POA: Insufficient documentation

## 2023-09-27 DIAGNOSIS — E669 Obesity, unspecified: Secondary | ICD-10-CM | POA: Diagnosis not present

## 2023-09-27 DIAGNOSIS — K3189 Other diseases of stomach and duodenum: Secondary | ICD-10-CM | POA: Diagnosis not present

## 2023-09-27 DIAGNOSIS — Z6828 Body mass index (BMI) 28.0-28.9, adult: Secondary | ICD-10-CM | POA: Insufficient documentation

## 2023-09-27 DIAGNOSIS — I4892 Unspecified atrial flutter: Secondary | ICD-10-CM | POA: Insufficient documentation

## 2023-09-27 DIAGNOSIS — K2950 Unspecified chronic gastritis without bleeding: Secondary | ICD-10-CM | POA: Diagnosis not present

## 2023-09-27 HISTORY — PX: BIOPSY: SHX5522

## 2023-09-27 HISTORY — PX: ESOPHAGOGASTRODUODENOSCOPY (EGD) WITH PROPOFOL: SHX5813

## 2023-09-27 HISTORY — PX: POLYPECTOMY: SHX5525

## 2023-09-27 SURGERY — ESOPHAGOGASTRODUODENOSCOPY (EGD) WITH PROPOFOL
Anesthesia: General

## 2023-09-27 MED ORDER — PROPOFOL 10 MG/ML IV BOLUS
INTRAVENOUS | Status: DC | PRN
  Administered 2023-09-27 (×3): 50 mg via INTRAVENOUS

## 2023-09-27 MED ORDER — LIDOCAINE 2% (20 MG/ML) 5 ML SYRINGE
INTRAMUSCULAR | Status: DC | PRN
  Administered 2023-09-27: 80 mg via INTRAVENOUS

## 2023-09-27 MED ORDER — PROPOFOL 10 MG/ML IV BOLUS
INTRAVENOUS | Status: AC
  Filled 2023-09-27: qty 20

## 2023-09-27 MED ORDER — LIDOCAINE HCL (PF) 2 % IJ SOLN
INTRAMUSCULAR | Status: AC
  Filled 2023-09-27: qty 5

## 2023-09-27 MED ORDER — LACTATED RINGERS IV SOLN
INTRAVENOUS | Status: DC | PRN
Start: 2023-09-27 — End: 2023-09-27

## 2023-09-27 NOTE — Op Note (Signed)
[facility] Patient Name: Meghan Keller Procedure Date: 09/27/2023 7:10 AM MRN: [id number] Date of Birth: May 07, 1946 Attending MD: Gennette Pac , MD, [id number] CSN: [id number] Age: 77 Admit Type: Outpatient Procedure:                Upper GI endoscopy Indications:              Suspected esophageal reflux; symptoms poorly                            controlled with current acid suppression regimen Providers:                Gennette Pac, MD, Francoise Ceo RN, RN, Nena Polio, RN, Lennice Sites Technician, Technician Referring MD:              Medicines:                Propofol per Anesthesia Complications:            No immediate complications. Estimated Blood Loss:     Estimated blood loss was minimal. Estimated blood                            loss was minimal. Procedure:                Pre-Anesthesia Assessment:                           - Prior to the procedure, a History and Physical                            was performed, and patient medications and                            allergies were reviewed. The patient's tolerance of                            previous anesthesia was also reviewed. The risks                            and benefits of the procedure and the sedation                            options and risks were discussed with the patient.                            All questions were answered, and informed consent                            was obtained. ASA Grade Assessment: II - A patient                            with mild systemic disease. After reviewing the  risks and benefits, the patient was deemed in                            satisfactory condition to undergo the procedure.                           After obtaining informed consent, the endoscope was                            passed under direct vision. Throughout the                            procedure, the patient's blood pressure,  pulse, and                            oxygen saturations were monitored continuously. The                            [id number]-H190 ([id number]) scope was introduced through the                            mouth, and advanced to the second part of duodenum.                            The upper GI endoscopy was accomplished without                            difficulty. The patient tolerated the procedure                            well. Scope In: 7:41:53 AM Scope Out: 7:49:16 AM Total Procedure Duration: 0 hours 7 minutes 23 seconds  Findings:      The examined esophagus was normal.      The duodenal bulb and second portion of the duodenum were normal.       Largest polyp was removed with cold biopsy forceps. Antral mucosa       biopsied for histology.      A few 3 mm pedunculated and sessile polyps were found in the entire       examined stomach. Impression:               Gastric polyp removed. antral erythema - status                            post biopsy                           - Normal esophagus.                           - Normal duodenal bulb and second portion of the                            duodenum. Moderate Sedation:      Moderate (conscious) sedation was personally administered by an  anesthesia professional. The following parameters were monitored: oxygen       saturation, heart rate, blood pressure, respiratory rate, EKG, adequacy       of pulmonary ventilation, and response to care. Recommendation:           Stop Nexium and Pepcid begin Protonix 40 mg orally                            twice daily. Follow-up on path. Office visit in 3                            months.                           - Patient has a contact number available for                            emergencies. The signs and symptoms of potential                            delayed complications were discussed with the                            patient. Return to normal activities tomorrow.                             Written discharge instructions were provided to the                            patient.                           - Resume previous diet.                           - Continue present medications. Follow-up on                            pathology. Procedure Code(s):        --- Professional ---                           [phone], Esophagogastroduodenoscopy, flexible,                            transoral; diagnostic, including collection of                            specimen(s) by brushing or washing, when performed                            (separate procedure) Diagnosis Code(s):        --- Professional ---                           K31.7, Polyp of stomach and duodenum CPT copyright 2022 American Medical Association. All rights reserved. The codes documented in  this report are preliminary and upon coder review may  be revised to meet current compliance requirements. Gerrit Friends. Maurina Fawaz, MD Gennette Pac, MD 09/27/2023 3:02:21 PM This report has been signed electronically. Number of Addenda: 0

## 2023-09-27 NOTE — Anesthesia Preprocedure Evaluation (Signed)
Anesthesia Evaluation  Patient identified by MRN, date of birth, ID band Patient awake    Reviewed: Allergy & Precautions, H&P , NPO status , Patient's Chart, lab work & pertinent test results, reviewed documented beta blocker date and time   Airway Mallampati: II  TM Distance: >3 FB Neck ROM: full    Dental no notable dental hx.    Pulmonary neg pulmonary ROS, sleep apnea    Pulmonary exam normal breath sounds clear to auscultation       Cardiovascular Exercise Tolerance: Good hypertension, negative cardio ROS + dysrhythmias Atrial Fibrillation  Rhythm:regular Rate:Normal     Neuro/Psych CVA negative neurological ROS  negative psych ROS   GI/Hepatic negative GI ROS, Neg liver ROS,GERD  ,,  Endo/Other  negative endocrine ROS    Renal/GU negative Renal ROS  negative genitourinary   Musculoskeletal   Abdominal   Peds  Hematology negative hematology ROS (+)   Anesthesia Other Findings   Reproductive/Obstetrics negative OB ROS                             Anesthesia Physical Anesthesia Plan  ASA: 3  Anesthesia Plan: General   Post-op Pain Management:    Induction:   PONV Risk Score and Plan: Propofol infusion  Airway Management Planned:   Additional Equipment:   Intra-op Plan:   Post-operative Plan:   Informed Consent: I have reviewed the patients History and Physical, chart, labs and discussed the procedure including the risks, benefits and alternatives for the proposed anesthesia with the patient or authorized representative who has indicated his/her understanding and acceptance.     Dental Advisory Given  Plan Discussed with: CRNA  Anesthesia Plan Comments:        Anesthesia Quick Evaluation

## 2023-09-27 NOTE — Anesthesia Postprocedure Evaluation (Signed)
Anesthesia Post Note  Patient: DESTINY HAGIN  Procedure(s) Performed: ESOPHAGOGASTRODUODENOSCOPY (EGD) WITH PROPOFOL BIOPSY POLYPECTOMY  Patient location during evaluation: Phase II Anesthesia Type: General Level of consciousness: awake Pain management: pain level controlled Vital Signs Assessment: post-procedure vital signs reviewed and stable Respiratory status: spontaneous breathing and respiratory function stable Cardiovascular status: blood pressure returned to baseline and stable Postop Assessment: no headache and no apparent nausea or vomiting Anesthetic complications: no Comments: Late entry   No notable events documented.   Last Vitals:  Vitals:   09/27/23 0755 09/27/23 0800  BP: (!) 104/44 (!) 113/46  Pulse: 61   Resp: (!) 22   Temp: 36.6 C   SpO2: 100%     Last Pain:  Vitals:   09/27/23 0755  TempSrc: Oral  PainSc:                  Windell Norfolk

## 2023-09-27 NOTE — Discharge Instructions (Addendum)
EGD Discharge instructions Please read the instructions outlined below and refer to this sheet in the next few weeks. These discharge instructions provide you with general information on caring for yourself after you leave the hospital. Your doctor may also give you specific instructions. While your treatment has been planned according to the most current medical practices available, unavoidable complications occasionally occur. If you have any problems or questions after discharge, please call your doctor. ACTIVITY You may resume your regular activity but move at a slower pace for the next 24 hours.  Take frequent rest periods for the next 24 hours.  Walking will help expel (get rid of) the air and reduce the bloated feeling in your abdomen.  No driving for 24 hours (because of the anesthesia (medicine) used during the test).  You may shower.  Do not sign any important legal documents or operate any machinery for 24 hours (because of the anesthesia used during the test).  NUTRITION Drink plenty of fluids.  You may resume your normal diet.  Begin with a light meal and progress to your normal diet.  Avoid alcoholic beverages for 24 hours or as instructed by your caregiver.  MEDICATIONS You may resume your normal medications unless your caregiver tells you otherwise.  WHAT YOU CAN EXPECT TODAY You may experience abdominal discomfort such as a feeling of fullness or "gas" pains.  FOLLOW-UP Your doctor will discuss the results of your test with you.  SEEK IMMEDIATE MEDICAL ATTENTION IF ANY OF THE FOLLOWING OCCUR: Excessive nausea (feeling sick to your stomach) and/or vomiting.  Severe abdominal pain and distention (swelling).  Trouble swallowing.  Temperature over 101 F (37.8 C).  Rectal bleeding or vomiting of blood.    Your esophagus appeared normal.  I removed a small polyp from your stomach.  Stomach mildly inflamed.  Biopsies taken.  GERD information provided  Stop Nexium and  Pepcid begin Protonix or pantoprazole 40 mg orally 30 minutes before breakfast and supper daily(a new prescription has been sent to your pharmacy from the office).  Further recommendations to follow pending review of pathology report  Office visit with me in 3 months at patient request, I called Harriette Bouillon at [phone] -reviewed findings and recommendations

## 2023-09-27 NOTE — H&P (Signed)
@LOGO @   Primary Care Physician:  Pomposini, Rande Brunt, MD Primary Gastroenterologist:  Dr. Jena Gauss  Pre-Procedure History & Physical: HPI:  Meghan Keller is a 77 y.o. female here for for EGD to further evaluate poorly controlled GERD.  Denies dysphagia.  Past Medical History:  Diagnosis Date   Adenomatous polyp    ascending colon   Aortic valve regurgitation    "mild"   Arthritis    Atrial fibrillation (HCC)    Atrial flutter (HCC)    Benign essential hypertension    Blood in urine 2017   Carotid bruit    CVA (cerebral vascular accident) (HCC) 01/2020   Diverticular disease    Dysrhythmia    Fibrocystic disease of breast    GERD with esophagitis    Hyperglycemia    Hyperlipidemia    Lipid storage disease    Obesity    Osteopenia    PSVT (paroxysmal supraventricular tachycardia) (HCC)    Sleep apnea    "mild", was on CPAP before stroke, pt states she needs to get back on it    Wide-angle glaucoma     Past Surgical History:  Procedure Laterality Date   BREAST BIOPSY Left 12/2007   Dr Morey Hummingbird- fibrocystic Fibroadenoma. pt reports this was benign both times   COLONOSCOPY N/A 10/20/2020   Procedure: COLONOSCOPY;  Surgeon: Malissa Hippo, MD;  Location: [facility];  Service: Endoscopy;  Laterality: N/A;  930   rotator cuff surgery Right 2006    Prior to Admission medications   Medication Sig Start Date End Date Taking? Authorizing Provider  Calcium Carb-Cholecalciferol (CALCIUM 600+D) 600-800 MG-UNIT TABS Take 1 tablet by mouth daily.    Yes [provider]  cetirizine (ZYRTEC) 5 MG tablet Take 5 mg by mouth daily.   Yes [provider]  Cyanocobalamin (VITAMIN B12) 500 MCG TABS Take 500 mcg by mouth daily.    Yes [provider]  esomeprazole (NEXIUM) 40 MG capsule Take 40 mg by mouth at bedtime.   Yes [provider]  estradiol (ESTRACE) 0.1 MG/GM vaginal cream SMARTSIG:Gram(s) Vaginal 2-3 Times Weekly   Yes [provider]  famotidine (PEPCID) 20 MG tablet Take 20 mg by mouth daily before breakfast.   Yes [provider]  Flaxseed, Linseed, (FLAXSEED OIL) 1000 MG CAPS Take 1,000 mg by mouth daily.    Yes [provider]  flecainide (TAMBOCOR) 50 MG tablet Take 50 mg by mouth in the morning and at bedtime. 07/30/22  Yes [provider]  folic acid (FOLVITE) 800 MCG tablet Take 800 mcg by mouth daily.    Yes [provider]  latanoprost (XALATAN) 0.005 % ophthalmic solution Place 1 drop into both eyes at bedtime.   Yes [provider]  metoprolol succinate (TOPROL-XL) 25 MG 24 hr tablet Take 25 mg by mouth daily. 12/27/19  Yes [provider]  metoprolol tartrate (LOPRESSOR) 25 MG tablet Take 25 mg by mouth as needed (for HR >100).   Yes [provider]  niacin (NIASPAN) 1000 MG CR tablet Take 1,000 mg by mouth in the morning and at bedtime. 02/08/20  Yes [provider]  Omega-3 Fatty Acids (OMEGA-3 FISH OIL) 1200 MG CAPS Take 1,200 mg by mouth daily.   Yes [provider]  pyridOXINE (VITAMIN B-6) 50 MG tablet Take 50 mg by mouth daily.   Yes [provider]  rosuvastatin (CRESTOR) 10 MG tablet Take 20 mg by mouth at bedtime. 02/24/20  Yes [provider]  ELIQUIS 5 MG TABS tablet Take 5 mg by mouth 2 (two) times daily. 02/27/20   [provider]    Allergies as of 08/30/2023 - Review Complete 07/30/2023  Allergen Reaction Noted   Anaprox [naproxen] Other (See Comments) 03/16/2020   Ansaid [flurbiprofen] Other (See Comments) 03/16/2020   Aspirin  03/16/2020   Lortab [hydrocodone-acetaminophen] Other (See Comments) 03/16/2020   Simvastatin Other (See Comments) 10/20/2020    Family History  Problem Relation Age of Onset   Bladder Cancer Mother    Leukemia Mother    Hypertension Mother    Other Mother        smoked   Esophageal cancer Father    Hypertension Father    Atrial fibrillation Father     Other Father        smoked   High Cholesterol Father    Glaucoma Father    Hypertension Brother    Heart attack Brother    Hypertension Brother    Atrial fibrillation Brother        heart ablation   High Cholesterol Brother    Stroke Paternal Grandfather        pt believes he had one in his 98s     Social History   Socioeconomic History   Marital status: Married    Spouse name: Not on file   Number of children: 2   Years of education: 12+   Highest education level: Not on file  Occupational History   Not on file  Tobacco Use   Smoking status: Never   Smokeless tobacco: Never  Vaping Use   Vaping status: Never Used  Substance and Sexual Activity   Alcohol use: Never   Drug use: Never   Sexual activity: Not on file  Other Topics Concern   Not on file  Social History Narrative   Lives at home with husband   Right handed   Caffeine: 2 cups/day    Social Determinants of Health   Financial Resource Strain: Not on file  Food Insecurity: Not on file  Transportation Needs: Not on file  Physical Activity: Not on file  Stress: Not on file  Social Connections: Not on file  Intimate Partner Violence: Not on file    Review of Systems: See HPI, otherwise negative ROS  Physical Exam: BP (!) 149/61 (BP Location: Right Arm)   Pulse (!) 56   Temp 97.9 F (36.6 C)   Resp 15   Ht 5\' 2"  (1.575 m)   Wt 71.5 kg   SpO2 100%   BMI 28.83 kg/m  General:   Alert,  Well-developed, well-nourished, pleasant and cooperative in NAD Neck:  Supple; no masses or thyromegaly. No significant cervical adenopathy. Lungs:  Clear throughout to auscultation.   No wheezes, crackles, or rhonchi. No acute distress. Heart:  Regular rate and rhythm; no murmurs, clicks, rubs,  or gallops. Abdomen: Non-distended, normal bowel sounds.  Soft and nontender without appreciable mass or hepatosplenomegaly.   Impression/Plan: 77 year old lady with poorly controlled GERD here for a diagnostic EGD.   Eliquis held x 3 days.  Denies dysphagia.  I will offer the patient a diagnostic EGD.  The risks, benefits, limitations, alternatives and imponderables have been reviewed with the patient. Potential for esophageal dilation, biopsy, etc. have also been reviewed.  Questions have been answered. All parties agreeable.      Notice: This dictation was prepared with Dragon dictation along with smaller phrase technology. Any transcriptional errors that result from this  process are unintentional and may not be corrected upon review.

## 2023-09-27 NOTE — Transfer of Care (Signed)
Immediate Anesthesia Transfer of Care Note  Patient: Meghan Keller  Procedure(s) Performed: ESOPHAGOGASTRODUODENOSCOPY (EGD) WITH PROPOFOL BIOPSY POLYPECTOMY  Patient Location: Endoscopy Unit  Anesthesia Type:General  Level of Consciousness: awake, alert , and oriented  Airway & Oxygen Therapy: Patient Spontanous Breathing  Post-op Assessment: Report given to RN and Post -op Vital signs reviewed and stable  Post vital signs: Reviewed and stable  Last Vitals:  Vitals Value Taken Time  BP 104/44 09/27/23 0755  Temp 36.6 C 09/27/23 0755  Pulse 61 09/27/23 0755  Resp 22 09/27/23 0755  SpO2 100 % 09/27/23 0755    Last Pain:  Vitals:   09/27/23 0755  TempSrc: Oral  PainSc:       Patients Stated Pain Goal: 6 (09/27/23 0755)  Complications: No notable events documented.

## 2023-09-30 ENCOUNTER — Encounter: Payer: Self-pay | Admitting: Internal Medicine

## 2023-09-30 LAB — SURGICAL PATHOLOGY

## 2023-10-03 ENCOUNTER — Encounter: Payer: Self-pay | Admitting: Internal Medicine

## 2023-10-07 ENCOUNTER — Encounter (HOSPITAL_COMMUNITY): Payer: Self-pay | Admitting: Internal Medicine

## 2023-10-15 ENCOUNTER — Other Ambulatory Visit: Payer: Self-pay

## 2023-10-15 MED ORDER — RABEPRAZOLE SODIUM 20 MG PO TBEC: 20.0000 mg | DELAYED_RELEASE_TABLET | Freq: Every day | ORAL | 11 refills | Status: DC

## 2023-11-28 ENCOUNTER — Encounter: Payer: Self-pay | Admitting: Internal Medicine

## 2023-12-31 ENCOUNTER — Ambulatory Visit: Payer: Medicare Other | Admitting: Internal Medicine

## 2023-12-31 ENCOUNTER — Encounter: Payer: Self-pay | Admitting: Internal Medicine

## 2023-12-31 VITALS — BP 155/68 | HR 51 | Temp 98.3°F | Ht 62.5 in | Wt 161.0 lb

## 2023-12-31 DIAGNOSIS — K219 Gastro-esophageal reflux disease without esophagitis: Secondary | ICD-10-CM

## 2023-12-31 MED ORDER — DEXLANSOPRAZOLE 60 MG PO CPDR: 60.0000 mg | DELAYED_RELEASE_CAPSULE | Freq: Every day | ORAL | 1 refills | Status: DC

## 2023-12-31 NOTE — Patient Instructions (Signed)
 It was good to see you again today!  Since you get dizzy every time you take rabeprazole  we will stop that medication.  We will begin Dexilant  60 mg capsule take 130 minutes before lunch daily.  Dispense 30 with 11 refills  Plan to see you back in 6 months  In the interim, if you have any problems taking Dexilant  please call me

## 2023-12-31 NOTE — Progress Notes (Signed)
 Primary Care Physician:  Pomposini, Toribio CROME, MD Primary Gastroenterologist:  Dr. Shaaron  Pre-Procedure History & Physical: HPI:  Meghan Keller is a 78 y.o. female here for follow-up of GERD.  Recent EGD demonstrated no esophagitis benign fundic gland polyp - removed.  Biopsies negative for H. pylori.  She reports rabeprazole  is helping with her reflux but every time she takes it   says she gets gets dizzy.  Reproducible on a regular basis.  She takes rabeprazole  in the mid afternoon - states that taking it in the morning before breakfast is too much for her with other medications.  Otherwise, doing well.  Previously on Nexium.  No history of Prevacid or Dexilant .  Past Medical History:  Diagnosis Date   Adenomatous polyp    ascending colon   Aortic valve regurgitation    mild   Arthritis    Atrial fibrillation (HCC)    Atrial flutter (HCC)    Benign essential hypertension    Blood in urine 2017   Carotid bruit    CVA (cerebral vascular accident) (HCC) 01/2020   Diverticular disease    Dysrhythmia    Fibrocystic disease of breast    GERD with esophagitis    Hyperglycemia    Hyperlipidemia    Lipid storage disease    Obesity    Osteopenia    PSVT (paroxysmal supraventricular tachycardia) (HCC)    Sleep apnea    mild, was on CPAP before stroke, pt states she needs to get back on it    Wide-angle glaucoma     Past Surgical History:  Procedure Laterality Date   BIOPSY  09/27/2023   Procedure: BIOPSY;  Surgeon: Shaaron Lamar HERO, MD;  Location: [facility];  Service: Endoscopy;;   BREAST BIOPSY Left 12/2007   Dr Mirian- fibrocystic Fibroadenoma. pt reports this was benign both times   COLONOSCOPY N/A 10/20/2020   Procedure: COLONOSCOPY;  Surgeon: Golda Claudis PENNER, MD;  Location: [facility];  Service: Endoscopy;  Laterality: N/A;  930   ESOPHAGOGASTRODUODENOSCOPY (EGD) WITH PROPOFOL  N/A 09/27/2023   Procedure: ESOPHAGOGASTRODUODENOSCOPY (EGD) WITH PROPOFOL ;   Surgeon: Shaaron Lamar HERO, MD;  Location: [facility];  Service: Endoscopy;  Laterality: N/A;  730AM, ASA 3   POLYPECTOMY  09/27/2023   Procedure: POLYPECTOMY;  Surgeon: Shaaron Lamar HERO, MD;  Location: [facility];  Service: Endoscopy;;   rotator cuff surgery Right 2006    Prior to Admission medications   Medication Sig Start Date End Date Taking? Authorizing Provider  Calcium Carb-Cholecalciferol (CALCIUM 600+D) 600-800 MG-UNIT TABS Take 1 tablet by mouth daily.    Yes [provider]  cetirizine (ZYRTEC) 5 MG tablet Take 5 mg by mouth daily.   Yes [provider]  Cyanocobalamin (VITAMIN B12) 500 MCG TABS Take 500 mcg by mouth daily.    Yes [provider]  ELIQUIS 5 MG TABS tablet Take 5 mg by mouth 2 (two) times daily. 02/27/20  Yes [provider]  estradiol (ESTRACE) 0.1 MG/GM vaginal cream SMARTSIG:Gram(s) Vaginal 2-3 Times Weekly   Yes [provider]  famotidine (PEPCID) 20 MG tablet Take 20 mg by mouth daily before breakfast.   Yes [provider]  Flaxseed, Linseed, (FLAXSEED OIL) 1000 MG CAPS Take 1,000 mg by mouth daily.    Yes [provider]  flecainide (TAMBOCOR) 50 MG tablet Take 50 mg by mouth in the morning and at bedtime. 07/30/22  Yes [provider]  folic acid (FOLVITE) 800  MCG tablet Take 800 mcg by mouth daily.    Yes [provider]  latanoprost (XALATAN) 0.005 % ophthalmic solution Place 1 drop into both eyes at bedtime.   Yes [provider]  metoprolol succinate (TOPROL-XL) 25 MG 24 hr tablet Take 25 mg by mouth daily. 12/27/19  Yes [provider]  metoprolol tartrate (LOPRESSOR) 25 MG tablet Take 25 mg by mouth as needed (for HR >100).   Yes [provider]  niacin (NIASPAN) 1000 MG CR tablet Take 1,000 mg by mouth in the morning and at bedtime. 02/08/20  Yes [provider]  Omega-3 Fatty Acids (OMEGA-3 FISH OIL) 1200 MG CAPS Take 1,200 mg by mouth  daily.   Yes [provider]  pyridOXINE (VITAMIN B-6) 50 MG tablet Take 50 mg by mouth daily.   Yes [provider]  RABEprazole  (ACIPHEX ) 20 MG tablet Take 1 tablet (20 mg total) by mouth daily. 10/15/23  Yes Aquilla Voiles, Lamar HERO, MD  rosuvastatin (CRESTOR) 10 MG tablet Take 20 mg by mouth at bedtime. 02/24/20  Yes [provider]    Allergies as of 12/31/2023 - Review Complete 12/31/2023  Allergen Reaction Noted   Anaprox [naproxen] Other (See Comments) 03/16/2020   Ansaid [flurbiprofen] Other (See Comments) 03/16/2020   Aspirin  03/16/2020   Lortab [hydrocodone-acetaminophen] Other (See Comments) 03/16/2020   Simvastatin Other (See Comments) 10/20/2020    Family History  Problem Relation Age of Onset   Bladder Cancer Mother    Leukemia Mother    Hypertension Mother    Other Mother        smoked   Esophageal cancer Father    Hypertension Father    Atrial fibrillation Father    Other Father        smoked   High Cholesterol Father    Glaucoma Father    Hypertension Brother    Heart attack Brother    Hypertension Brother    Atrial fibrillation Brother        heart ablation   High Cholesterol Brother    Stroke Paternal Grandfather        pt believes he had one in his 16s     Social History   Socioeconomic History   Marital status: Married    Spouse name: Not on file   Number of children: 2   Years of education: 12+   Highest education level: Not on file  Occupational History   Not on file  Tobacco Use   Smoking status: Never   Smokeless tobacco: Never  Vaping Use   Vaping status: Never Used  Substance and Sexual Activity   Alcohol use: Never   Drug use: Never   Sexual activity: Not on file  Other Topics Concern   Not on file  Social History Narrative   Lives at home with husband   Right handed   Caffeine: 2 cups/day    Social Drivers of Corporate Investment Banker Strain: Not on file  Food Insecurity: Not on file  Transportation  Needs: Not on file  Physical Activity: Not on file  Stress: Not on file  Social Connections: Not on file  Intimate Partner Violence: Not on file    Review of Systems: See HPI, otherwise negative ROS  Physical Exam: BP (!) 155/68 (BP Location: Right Arm, Patient Position: Sitting, Cuff Size: Normal)   Pulse (!) 51   Temp 98.3 F (36.8 C) (Oral)   Ht 5' 2.5 (1.588 m)   Wt 161  lb (73 kg)   SpO2 97%   BMI 28.98 kg/m  General:   Alert,  Well-developed, well-nourished, pleasant and cooperative in NAD Impression/Plan: 78 year old lady with uncomplicated GERD responsive to rabeprazole .  Takes it mid afternoon which is not ideal.  Reports reproducibly getting dizziness every time she takes it.  Recommendations: We will begin Dexilant  60 mg capsule take (1)30 minutes before lunch daily.  Dispense 30 with 11 refills.  Stop rabeprazole .  Plan to see back in 6 months  In the interim, she is to call if she runs into any problems.     Notice: This dictation was prepared with Dragon dictation along with smaller phrase technology. Any transcriptional errors that result from this process are unintentional and may not be corrected upon review.

## 2024-01-13 ENCOUNTER — Other Ambulatory Visit: Payer: Self-pay

## 2024-01-13 MED ORDER — DEXLANSOPRAZOLE 60 MG PO CPDR: 60.0000 mg | DELAYED_RELEASE_CAPSULE | Freq: Every day | ORAL | 3 refills | Status: DC

## 2024-04-07 NOTE — Progress Notes (Unsigned)
 Meghan Keller

## 2024-04-08 ENCOUNTER — Ambulatory Visit: Payer: Medicare Other | Admitting: Adult Health

## 2024-04-08 ENCOUNTER — Encounter: Payer: Self-pay | Admitting: Adult Health

## 2024-04-08 VITALS — BP 138/64 | HR 68 | Ht 62.0 in | Wt 160.0 lb

## 2024-04-08 DIAGNOSIS — G4733 Obstructive sleep apnea (adult) (pediatric): Secondary | ICD-10-CM | POA: Diagnosis not present

## 2024-04-08 NOTE — Patient Instructions (Addendum)
 Your Plan:  Continue nightly use of CPAP with greater than 4 hours per night for optimal benefit and per insurance requirements  Continue to follow with your DME company [facility] for any needs supplies or CPAP related concerns     Follow-up in 1 year or call earlier if needed     Thank you for coming to see us  at [facility]. I hope we have been able to provide you high quality care today.  You may receive a patient satisfaction survey over the next few weeks. We would appreciate your feedback and comments so that we may continue to improve ourselves and the health of our patients.

## 2024-06-05 ENCOUNTER — Encounter: Payer: Self-pay | Admitting: Internal Medicine

## 2024-06-30 ENCOUNTER — Encounter: Payer: Self-pay | Admitting: Internal Medicine

## 2024-06-30 ENCOUNTER — Ambulatory Visit: Admitting: Internal Medicine

## 2024-06-30 VITALS — BP 134/81 | HR 80 | Temp 97.9°F | Ht 62.5 in | Wt 165.4 lb

## 2024-06-30 DIAGNOSIS — K219 Gastro-esophageal reflux disease without esophagitis: Secondary | ICD-10-CM | POA: Diagnosis not present

## 2024-06-30 DIAGNOSIS — Z860100 Personal history of colon polyps, unspecified: Secondary | ICD-10-CM | POA: Diagnosis not present

## 2024-06-30 NOTE — Progress Notes (Unsigned)
 Primary Care Physician:  Shona Norleen PEDLAR, MD Primary Gastroenterologist:  Dr. Shaaron  Pre-Procedure History & Physical: HPI:  Meghan Keller is a 78 y.o. female here for follow-up of uncomplicated GERD.  Dexilant  has done extremely well; it has abolished her reflux taking it 30 minutes before lunch daily.  No dysphagia. No bowel symptoms.  Last 2 colonoscopies were negative.  There was mention if brother had a large polyp burden, consider  1 more colonoscopy.  Patient is unaware of any polyps in brothers history.  Now sees Dr. Christyne Shona here as her PCP. She continues to be anticoagulated on Xarelto.  She is to get an ablation later this year.  Past Medical History:  Diagnosis Date   Adenomatous polyp    ascending colon   Aortic valve regurgitation    mild   Arthritis    Atrial fibrillation (HCC)    Atrial flutter (HCC)    Benign essential hypertension    Blood in urine 2017   Carotid bruit    CVA (cerebral vascular accident) (HCC) 01/2020   Diverticular disease    Dysrhythmia    Fibrocystic disease of breast    GERD with esophagitis    Hyperglycemia    Hyperlipidemia    Lipid storage disease    Obesity    Osteopenia    PSVT (paroxysmal supraventricular tachycardia) (HCC)    Sleep apnea    mild, was on CPAP before stroke, pt states she needs to get back on it    Wide-angle glaucoma     Past Surgical History:  Procedure Laterality Date   BIOPSY  09/27/2023   Procedure: BIOPSY;  Surgeon: Shaaron Lamar HERO, MD;  Location: [facility];  Service: Endoscopy;;   BREAST BIOPSY Left 12/2007   Dr Mirian- fibrocystic Fibroadenoma. pt reports this was benign both times   COLONOSCOPY N/A 10/20/2020   Procedure: COLONOSCOPY;  Surgeon: Golda Claudis PENNER, MD;  Location: [facility];  Service: Endoscopy;  Laterality: N/A;  930   ESOPHAGOGASTRODUODENOSCOPY (EGD) WITH PROPOFOL  N/A 09/27/2023   Procedure: ESOPHAGOGASTRODUODENOSCOPY (EGD) WITH PROPOFOL ;  Surgeon: Shaaron Lamar HERO, MD;   Location: [facility];  Service: Endoscopy;  Laterality: N/A;  730AM, ASA 3   POLYPECTOMY  09/27/2023   Procedure: POLYPECTOMY;  Surgeon: Shaaron Lamar HERO, MD;  Location: [facility];  Service: Endoscopy;;   rotator cuff surgery Right 2006    Prior to Admission medications   Medication Sig Start Date End Date Taking? Authorizing Provider  Calcium Carb-Cholecalciferol (CALCIUM 600+D) 600-800 MG-UNIT TABS Take 1 tablet by mouth daily.    Yes [provider]  cetirizine (ZYRTEC) 5 MG tablet Take 5 mg by mouth daily.   Yes [provider]  Cyanocobalamin (VITAMIN B12) 500 MCG TABS Take 500 mcg by mouth daily.    Yes [provider]  dexlansoprazole  (DEXILANT ) 60 MG capsule Take 1 capsule (60 mg total) by mouth daily. 01/13/24  Yes Iyanla Eilers, Lamar HERO, MD  ELIQUIS 5 MG TABS tablet Take 5 mg by mouth 2 (two) times daily. 02/27/20  Yes [provider]  estradiol (ESTRACE) 0.1 MG/GM vaginal cream SMARTSIG:Gram(s) Vaginal 2-3 Times Weekly   Yes [provider]  Flaxseed, Linseed, (FLAXSEED OIL) 1000 MG CAPS Take 1,000 mg by mouth daily.    Yes [provider]  flecainide (TAMBOCOR) 50 MG tablet Take 50 mg by mouth in the morning and at bedtime. 07/30/22  Yes [provider]  folic acid (FOLVITE) 800 MCG  tablet Take 800 mcg by mouth daily.    Yes [provider]  latanoprost (XALATAN) 0.005 % ophthalmic solution Place 1 drop into both eyes at bedtime.   Yes [provider]  metoprolol succinate (TOPROL-XL) 25 MG 24 hr tablet Take 25 mg by mouth daily. 12/27/19  Yes [provider]  metoprolol tartrate (LOPRESSOR) 25 MG tablet Take 25 mg by mouth as needed (for HR >100).   Yes [provider]  Omega-3 Fatty Acids (OMEGA-3 FISH OIL) 1200 MG CAPS Take 1,200 mg by mouth daily.   Yes [provider]  pyridOXINE (VITAMIN B-6) 50 MG tablet Take 50 mg by mouth daily.   Yes [provider]  rosuvastatin  (CRESTOR) 10 MG tablet Take 20 mg by mouth at bedtime. 02/24/20  Yes [provider]  famotidine (PEPCID) 20 MG tablet Take 20 mg by mouth daily before breakfast. Patient not taking: Reported on 06/30/2024    [provider]  niacin (NIASPAN) 1000 MG CR tablet Take 1,000 mg by mouth in the morning and at bedtime. Patient not taking: Reported on 06/30/2024 02/08/20   [provider]    Allergies as of 06/30/2024 - Review Complete 06/30/2024  Allergen Reaction Noted   Anaprox [naproxen] Other (See Comments) 03/16/2020   Ansaid [flurbiprofen] Other (See Comments) 03/16/2020   Aspirin  03/16/2020   Lortab [hydrocodone-acetaminophen] Other (See Comments) 03/16/2020   Simvastatin Other (See Comments) 10/20/2020    Family History  Problem Relation Age of Onset   Bladder Cancer Mother    Leukemia Mother    Hypertension Mother    Other Mother        smoked   Esophageal cancer Father    Hypertension Father    Atrial fibrillation Father    Other Father        smoked   High Cholesterol Father    Glaucoma Father    Hypertension Brother    Heart attack Brother    Hypertension Brother    Atrial fibrillation Brother        heart ablation   High Cholesterol Brother    Stroke Paternal Grandfather        pt believes he had one in his 35s     Social History   Socioeconomic History   Marital status: Married    Spouse name: Not on file   Number of children: 2   Years of education: 12+   Highest education level: Not on file  Occupational History   Not on file  Tobacco Use   Smoking status: Never   Smokeless tobacco: Never  Vaping Use   Vaping status: Never Used  Substance and Sexual Activity   Alcohol use: Never   Drug use: Never   Sexual activity: Not on file  Other Topics Concern   Not on file  Social History Narrative   Lives at home with husband   Right handed   Caffeine: 2 cups/day    Social Drivers of Corporate investment banker Strain: Not on  file  Food Insecurity: Not on file  Transportation Needs: Not on file  Physical Activity: Not on file  Stress: Not on file  Social Connections: Not on file  Intimate Partner Violence: Not on file    Review of Systems: See HPI, otherwise negative ROS  Physical Exam: BP 134/81 (BP Location: Right Arm, Patient Position: Sitting, Cuff Size: Normal)   Pulse 80   Temp 97.9 F (36.6 C) (Temporal)   Ht 5'  2.5 (1.588 m)   Wt 165 lb 6.4 oz (75 kg)   BMI 29.77 kg/m  General:   Alert,  Well-developed, well-nourished, pleasant and cooperative in NAD Lungs:  Clear throughout to auscultation.   No wheezes, crackles, or rhonchi. No acute distress. Heart: Irregularly irregular rhythm  abdomen: Non-distended, normal bowel sounds.  Soft and nontender without appreciable mass or hepatosplenomegaly.   Impression/Plan: 78 year old lady with uncomplicated GERD well-controlled on Dexilant .  No GI symptoms at this time  Distant history of colonic polyps.  Unless patient has new significant family history, I do not think she needs another colonoscopy moving forward.  Recommendations:  Continue Dexilant  60 mg daily best taken 30 minutes before lunch  GERD information provided  Unless something comes up we will plan to see you back in 1 year        Notice: This dictation was prepared with Dragon dictation along with smaller phrase technology. Any transcriptional errors that result from this process are unintentional and may not be corrected upon review.

## 2024-06-30 NOTE — Patient Instructions (Signed)
 Good to see you again today!  Continue Dexilant  60 mg daily best taken 30 minutes before lunch  GERD information provided  Unless something comes up we will plan to see you back in 1 year

## 2024-06-30 NOTE — Progress Notes (Unsigned)
 error

## 2024-12-31 ENCOUNTER — Other Ambulatory Visit: Payer: Self-pay | Admitting: Internal Medicine

## 2025-03-22 ENCOUNTER — Ambulatory Visit: Admitting: Adult Health
